# Patient Record
Sex: Female | Born: 1975 | Race: White | Hispanic: No | Marital: Married | Smoking: Current every day smoker
Health system: Southern US, Community
[De-identification: ages and names within clinical notes are randomized; demographics above are authoritative.]

## PROBLEM LIST (undated history)

## (undated) DIAGNOSIS — E0590 Thyrotoxicosis, unspecified without thyrotoxic crisis or storm: Secondary | ICD-10-CM

## (undated) DIAGNOSIS — E0500 Thyrotoxicosis with diffuse goiter without thyrotoxic crisis or storm: Secondary | ICD-10-CM

## (undated) DIAGNOSIS — G43901 Migraine, unspecified, not intractable, with status migrainosus: Secondary | ICD-10-CM

## (undated) DIAGNOSIS — I10 Essential (primary) hypertension: Secondary | ICD-10-CM

## (undated) DIAGNOSIS — D649 Anemia, unspecified: Secondary | ICD-10-CM

## (undated) DIAGNOSIS — S46811A Strain of other muscles, fascia and tendons at shoulder and upper arm level, right arm, initial encounter: Secondary | ICD-10-CM

## (undated) DIAGNOSIS — N10 Acute tubulo-interstitial nephritis: Secondary | ICD-10-CM

## (undated) HISTORY — DX: Essential (primary) hypertension: I10

## (undated) HISTORY — DX: Thyrotoxicosis with diffuse goiter without thyrotoxic crisis or storm: E05.00

## (undated) HISTORY — DX: Thyrotoxicosis, unspecified without thyrotoxic crisis or storm: E05.90

## (undated) HISTORY — PX: TUBAL LIGATION: SHX77

---
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
## 1998-11-13 ENCOUNTER — Encounter: Admission: RE | Admit: 1998-11-13 | Discharge: 1998-11-13 | Payer: Self-pay | Admitting: Family Medicine

## 1998-12-15 ENCOUNTER — Encounter: Admission: RE | Admit: 1998-12-15 | Discharge: 1998-12-15 | Payer: Self-pay | Admitting: Family Medicine

## 1999-06-05 ENCOUNTER — Encounter: Admission: RE | Admit: 1999-06-05 | Discharge: 1999-06-05 | Payer: Self-pay | Admitting: Family Medicine

## 1999-07-14 ENCOUNTER — Encounter: Admission: RE | Admit: 1999-07-14 | Discharge: 1999-07-14 | Payer: Self-pay | Admitting: Sports Medicine

## 1999-07-27 ENCOUNTER — Encounter: Admission: RE | Admit: 1999-07-27 | Discharge: 1999-07-27 | Payer: Self-pay | Admitting: Family Medicine

## 1999-08-14 ENCOUNTER — Encounter: Admission: RE | Admit: 1999-08-14 | Discharge: 1999-08-14 | Payer: Self-pay | Admitting: Family Medicine

## 1999-09-17 ENCOUNTER — Encounter: Admission: RE | Admit: 1999-09-17 | Discharge: 1999-09-17 | Payer: Self-pay | Admitting: Family Medicine

## 1999-10-13 ENCOUNTER — Encounter: Admission: RE | Admit: 1999-10-13 | Discharge: 1999-10-13 | Payer: Self-pay | Admitting: Family Medicine

## 1999-11-02 ENCOUNTER — Encounter: Admission: RE | Admit: 1999-11-02 | Discharge: 1999-11-02 | Payer: Self-pay | Admitting: Family Medicine

## 1999-12-18 ENCOUNTER — Inpatient Hospital Stay (HOSPITAL_COMMUNITY): Admission: AD | Admit: 1999-12-18 | Discharge: 1999-12-18 | Payer: Self-pay | Admitting: Obstetrics & Gynecology

## 1999-12-25 ENCOUNTER — Encounter: Admission: RE | Admit: 1999-12-25 | Discharge: 1999-12-25 | Payer: Self-pay | Admitting: Sports Medicine

## 2000-01-04 ENCOUNTER — Other Ambulatory Visit: Admission: RE | Admit: 2000-01-04 | Discharge: 2000-01-04 | Payer: Self-pay | Admitting: *Deleted

## 2000-01-04 ENCOUNTER — Encounter: Admission: RE | Admit: 2000-01-04 | Discharge: 2000-01-04 | Payer: Self-pay | Admitting: Family Medicine

## 2000-01-14 ENCOUNTER — Ambulatory Visit (HOSPITAL_COMMUNITY): Admission: RE | Admit: 2000-01-14 | Discharge: 2000-01-14 | Payer: Self-pay | Admitting: *Deleted

## 2000-01-14 ENCOUNTER — Encounter: Payer: Self-pay | Admitting: *Deleted

## 2000-03-01 ENCOUNTER — Encounter: Admission: RE | Admit: 2000-03-01 | Discharge: 2000-03-01 | Payer: Self-pay | Admitting: Family Medicine

## 2000-03-13 ENCOUNTER — Inpatient Hospital Stay (HOSPITAL_COMMUNITY): Admission: AD | Admit: 2000-03-13 | Discharge: 2000-03-13 | Payer: Self-pay | Admitting: Obstetrics

## 2000-05-06 ENCOUNTER — Inpatient Hospital Stay (HOSPITAL_COMMUNITY): Admission: AD | Admit: 2000-05-06 | Discharge: 2000-05-06 | Payer: Self-pay | Admitting: *Deleted

## 2000-05-12 ENCOUNTER — Encounter: Admission: RE | Admit: 2000-05-12 | Discharge: 2000-05-12 | Payer: Self-pay | Admitting: Family Medicine

## 2000-05-19 ENCOUNTER — Encounter: Admission: RE | Admit: 2000-05-19 | Discharge: 2000-05-19 | Payer: Self-pay | Admitting: Family Medicine

## 2000-05-20 ENCOUNTER — Inpatient Hospital Stay (HOSPITAL_COMMUNITY): Admission: AD | Admit: 2000-05-20 | Discharge: 2000-05-21 | Payer: Self-pay | Admitting: Obstetrics & Gynecology

## 2000-06-06 ENCOUNTER — Inpatient Hospital Stay (HOSPITAL_COMMUNITY): Admission: AD | Admit: 2000-06-06 | Discharge: 2000-06-06 | Payer: Self-pay | Admitting: Obstetrics

## 2000-06-10 ENCOUNTER — Encounter (INDEPENDENT_AMBULATORY_CARE_PROVIDER_SITE_OTHER): Payer: Self-pay | Admitting: Specialist

## 2000-06-10 ENCOUNTER — Inpatient Hospital Stay (HOSPITAL_COMMUNITY): Admission: AD | Admit: 2000-06-10 | Discharge: 2000-06-14 | Payer: Self-pay | Admitting: Obstetrics & Gynecology

## 2000-06-19 ENCOUNTER — Inpatient Hospital Stay: Admission: AD | Admit: 2000-06-19 | Discharge: 2000-06-19 | Payer: Self-pay | Admitting: Obstetrics

## 2000-06-20 ENCOUNTER — Inpatient Hospital Stay (HOSPITAL_COMMUNITY): Admission: AD | Admit: 2000-06-20 | Discharge: 2000-06-20 | Payer: Self-pay | Admitting: Obstetrics

## 2000-06-22 ENCOUNTER — Inpatient Hospital Stay (HOSPITAL_COMMUNITY): Admission: AD | Admit: 2000-06-22 | Discharge: 2000-06-22 | Payer: Self-pay | Admitting: Obstetrics

## 2000-06-23 ENCOUNTER — Inpatient Hospital Stay (HOSPITAL_COMMUNITY): Admission: AD | Admit: 2000-06-23 | Discharge: 2000-06-23 | Payer: Self-pay | Admitting: Obstetrics & Gynecology

## 2000-12-30 ENCOUNTER — Encounter: Admission: RE | Admit: 2000-12-30 | Discharge: 2000-12-30 | Payer: Self-pay | Admitting: Family Medicine

## 2001-10-28 ENCOUNTER — Emergency Department (HOSPITAL_COMMUNITY): Admission: EM | Admit: 2001-10-28 | Discharge: 2001-10-28 | Payer: Self-pay | Admitting: Emergency Medicine

## 2002-03-08 ENCOUNTER — Emergency Department (HOSPITAL_COMMUNITY): Admission: EM | Admit: 2002-03-08 | Discharge: 2002-03-08 | Payer: Self-pay | Admitting: Emergency Medicine

## 2002-12-18 ENCOUNTER — Emergency Department (HOSPITAL_COMMUNITY): Admission: EM | Admit: 2002-12-18 | Discharge: 2002-12-18 | Payer: Self-pay | Admitting: Emergency Medicine

## 2004-02-20 ENCOUNTER — Encounter: Admission: RE | Admit: 2004-02-20 | Discharge: 2004-02-20 | Payer: Self-pay | Admitting: Family Medicine

## 2004-03-27 LAB — CONVERTED CEMR LAB: Pap Smear: NORMAL

## 2004-04-08 ENCOUNTER — Other Ambulatory Visit: Admission: RE | Admit: 2004-04-08 | Discharge: 2004-04-08 | Payer: Self-pay | Admitting: Family Medicine

## 2004-04-08 ENCOUNTER — Encounter: Admission: RE | Admit: 2004-04-08 | Discharge: 2004-04-08 | Payer: Self-pay | Admitting: Family Medicine

## 2007-06-23 ENCOUNTER — Emergency Department (HOSPITAL_COMMUNITY): Admission: EM | Admit: 2007-06-23 | Discharge: 2007-06-24 | Payer: Self-pay | Admitting: Emergency Medicine

## 2008-08-09 ENCOUNTER — Emergency Department (HOSPITAL_COMMUNITY): Admission: EM | Admit: 2008-08-09 | Discharge: 2008-08-09 | Payer: Self-pay | Admitting: Emergency Medicine

## 2008-10-08 ENCOUNTER — Ambulatory Visit: Payer: Self-pay | Admitting: Family Medicine

## 2008-10-08 ENCOUNTER — Encounter: Payer: Self-pay | Admitting: Family Medicine

## 2008-10-08 DIAGNOSIS — G43909 Migraine, unspecified, not intractable, without status migrainosus: Secondary | ICD-10-CM | POA: Insufficient documentation

## 2008-10-14 ENCOUNTER — Encounter: Payer: Self-pay | Admitting: Family Medicine

## 2008-10-17 ENCOUNTER — Ambulatory Visit: Payer: Self-pay | Admitting: Family Medicine

## 2008-10-17 ENCOUNTER — Telehealth: Payer: Self-pay | Admitting: Family Medicine

## 2008-11-08 ENCOUNTER — Ambulatory Visit: Payer: Self-pay | Admitting: Family Medicine

## 2008-11-08 DIAGNOSIS — F411 Generalized anxiety disorder: Secondary | ICD-10-CM | POA: Insufficient documentation

## 2008-11-08 DIAGNOSIS — F419 Anxiety disorder, unspecified: Secondary | ICD-10-CM | POA: Insufficient documentation

## 2008-11-12 ENCOUNTER — Encounter: Payer: Self-pay | Admitting: Family Medicine

## 2008-11-14 ENCOUNTER — Encounter: Payer: Self-pay | Admitting: Family Medicine

## 2008-11-21 ENCOUNTER — Encounter: Payer: Self-pay | Admitting: Family Medicine

## 2009-01-21 ENCOUNTER — Emergency Department (HOSPITAL_COMMUNITY): Admission: EM | Admit: 2009-01-21 | Discharge: 2009-01-21 | Payer: Self-pay | Admitting: Emergency Medicine

## 2009-01-22 ENCOUNTER — Ambulatory Visit: Payer: Self-pay | Admitting: Family Medicine

## 2009-01-23 ENCOUNTER — Telehealth: Payer: Self-pay | Admitting: Family Medicine

## 2009-03-04 ENCOUNTER — Emergency Department (HOSPITAL_COMMUNITY): Admission: EM | Admit: 2009-03-04 | Discharge: 2009-03-04 | Payer: Self-pay | Admitting: Emergency Medicine

## 2009-03-05 ENCOUNTER — Ambulatory Visit: Payer: Self-pay | Admitting: Family Medicine

## 2009-03-06 ENCOUNTER — Telehealth: Payer: Self-pay | Admitting: Family Medicine

## 2009-04-03 ENCOUNTER — Emergency Department (HOSPITAL_COMMUNITY): Admission: EM | Admit: 2009-04-03 | Discharge: 2009-04-03 | Payer: Self-pay | Admitting: Emergency Medicine

## 2009-06-14 ENCOUNTER — Encounter (INDEPENDENT_AMBULATORY_CARE_PROVIDER_SITE_OTHER): Payer: Self-pay | Admitting: *Deleted

## 2009-06-14 DIAGNOSIS — F17200 Nicotine dependence, unspecified, uncomplicated: Secondary | ICD-10-CM | POA: Insufficient documentation

## 2009-11-11 ENCOUNTER — Encounter: Payer: Self-pay | Admitting: Family Medicine

## 2009-11-12 ENCOUNTER — Ambulatory Visit: Payer: Self-pay | Admitting: Family Medicine

## 2009-11-14 ENCOUNTER — Telehealth: Payer: Self-pay | Admitting: Psychology

## 2009-11-18 ENCOUNTER — Telehealth: Payer: Self-pay | Admitting: Family Medicine

## 2009-11-20 ENCOUNTER — Ambulatory Visit: Payer: Self-pay | Admitting: Family Medicine

## 2009-11-20 ENCOUNTER — Telehealth: Payer: Self-pay | Admitting: Psychology

## 2009-11-20 DIAGNOSIS — F329 Major depressive disorder, single episode, unspecified: Secondary | ICD-10-CM

## 2009-12-02 ENCOUNTER — Telehealth: Payer: Self-pay | Admitting: Family Medicine

## 2010-01-01 ENCOUNTER — Encounter: Payer: Self-pay | Admitting: *Deleted

## 2010-01-01 ENCOUNTER — Ambulatory Visit: Payer: Self-pay | Admitting: Family Medicine

## 2010-01-09 ENCOUNTER — Telehealth (INDEPENDENT_AMBULATORY_CARE_PROVIDER_SITE_OTHER): Payer: Self-pay | Admitting: *Deleted

## 2010-01-14 ENCOUNTER — Ambulatory Visit: Payer: Self-pay | Admitting: Family Medicine

## 2010-01-14 ENCOUNTER — Telehealth: Payer: Self-pay | Admitting: Family Medicine

## 2010-01-23 ENCOUNTER — Telehealth: Payer: Self-pay | Admitting: Family Medicine

## 2010-01-29 ENCOUNTER — Encounter: Payer: Self-pay | Admitting: *Deleted

## 2010-01-29 ENCOUNTER — Telehealth: Payer: Self-pay | Admitting: Family Medicine

## 2010-02-02 ENCOUNTER — Encounter: Payer: Self-pay | Admitting: Family Medicine

## 2010-03-31 ENCOUNTER — Ambulatory Visit: Payer: Self-pay | Admitting: Family Medicine

## 2010-03-31 ENCOUNTER — Inpatient Hospital Stay (HOSPITAL_COMMUNITY): Admission: EM | Admit: 2010-03-31 | Discharge: 2010-04-06 | Payer: Self-pay | Admitting: Emergency Medicine

## 2010-03-31 ENCOUNTER — Encounter: Payer: Self-pay | Admitting: *Deleted

## 2010-04-16 ENCOUNTER — Telehealth: Payer: Self-pay | Admitting: *Deleted

## 2010-04-22 ENCOUNTER — Encounter: Payer: Self-pay | Admitting: Family Medicine

## 2010-06-12 ENCOUNTER — Encounter: Payer: Self-pay | Admitting: *Deleted

## 2010-06-19 ENCOUNTER — Ambulatory Visit: Payer: Self-pay | Admitting: Family Medicine

## 2010-07-02 ENCOUNTER — Telehealth: Payer: Self-pay | Admitting: Family Medicine

## 2010-07-02 ENCOUNTER — Ambulatory Visit: Payer: Self-pay | Admitting: Family Medicine

## 2010-07-02 ENCOUNTER — Encounter: Payer: Self-pay | Admitting: Family Medicine

## 2010-07-02 DIAGNOSIS — E041 Nontoxic single thyroid nodule: Secondary | ICD-10-CM

## 2010-07-03 ENCOUNTER — Ambulatory Visit: Payer: Self-pay | Admitting: Family Medicine

## 2010-07-03 LAB — CONVERTED CEMR LAB
Free T4: 2.2 ng/dL — ABNORMAL HIGH (ref 0.80–1.80)
HCT: 36 % (ref 36.0–46.0)
Hemoglobin: 11.2 g/dL — ABNORMAL LOW (ref 12.0–15.0)
MCHC: 31.1 g/dL (ref 30.0–36.0)
RBC: 4.82 M/uL (ref 3.87–5.11)
RDW: 17.5 % — ABNORMAL HIGH (ref 11.5–15.5)
T3, Free: 7 pg/mL — ABNORMAL HIGH (ref 2.3–4.2)
WBC: 4.8 10*3/uL (ref 4.0–10.5)

## 2010-07-08 ENCOUNTER — Emergency Department (HOSPITAL_COMMUNITY): Admission: EM | Admit: 2010-07-08 | Discharge: 2010-07-09 | Payer: Self-pay | Admitting: Emergency Medicine

## 2010-07-09 ENCOUNTER — Encounter: Payer: Self-pay | Admitting: Family Medicine

## 2010-07-11 ENCOUNTER — Emergency Department (HOSPITAL_COMMUNITY): Admission: EM | Admit: 2010-07-11 | Discharge: 2010-07-12 | Payer: Self-pay | Admitting: Emergency Medicine

## 2010-07-13 ENCOUNTER — Telehealth: Payer: Self-pay | Admitting: Psychology

## 2010-07-16 ENCOUNTER — Encounter (HOSPITAL_COMMUNITY)
Admission: RE | Admit: 2010-07-16 | Discharge: 2010-09-26 | Payer: Self-pay | Source: Home / Self Care | Attending: Family Medicine | Admitting: Family Medicine

## 2010-07-22 ENCOUNTER — Ambulatory Visit: Payer: Self-pay | Admitting: Family Medicine

## 2010-07-22 ENCOUNTER — Telehealth: Payer: Self-pay | Admitting: Family Medicine

## 2010-07-22 DIAGNOSIS — E0500 Thyrotoxicosis with diffuse goiter without thyrotoxic crisis or storm: Secondary | ICD-10-CM | POA: Insufficient documentation

## 2010-07-27 ENCOUNTER — Encounter: Payer: Self-pay | Admitting: Family Medicine

## 2010-07-30 ENCOUNTER — Ambulatory Visit (HOSPITAL_COMMUNITY): Admission: RE | Admit: 2010-07-30 | Discharge: 2010-07-30 | Payer: Self-pay | Admitting: Family Medicine

## 2010-07-30 ENCOUNTER — Encounter: Payer: Self-pay | Admitting: Family Medicine

## 2010-07-31 ENCOUNTER — Telehealth: Payer: Self-pay | Admitting: Family Medicine

## 2010-08-07 ENCOUNTER — Ambulatory Visit: Payer: Self-pay | Admitting: Family Medicine

## 2010-09-04 ENCOUNTER — Ambulatory Visit: Payer: Self-pay | Admitting: Family Medicine

## 2010-10-12 ENCOUNTER — Telehealth: Payer: Self-pay | Admitting: Family Medicine

## 2010-10-23 ENCOUNTER — Telehealth: Payer: Self-pay | Admitting: Family Medicine

## 2010-10-27 NOTE — Assessment & Plan Note (Signed)
Summary: follow up from yesterday/kf   Vital Signs:  Patient profile:   35 year old female Menstrual status:  regular Height:      69 inches Weight:      182.4 pounds BMI:     27.03 Temp:     98.6 degrees F oral Pulse rate:   105 / minute BP sitting:   142 / 84  (left arm) Cuff size:   regular  Vitals Entered By: Garen Grams LPN (July 03, 2010 10:24 AM) CC: f/u labs Is Patient Diabetic? No Pain Assessment Patient in pain? no        Primary Care Provider:  Bobby Rumpf  MD  CC:  f/u labs.  History of Present Illness: 1) Hyperthyroid - During last hospitalization in July 2011 for pyelonephritis, also found to have enlarged thyroid, labs revealed low TSH, mildly elevated free T3, T4.  Thyroid ultrasound revealed slight enlargement of right lobe of thyroid with 2 adjacent 3.1 and 1.9 cm heterogenous solid nodules. Further work up was not performed during that hospitalization. Patient reports DECREASED energy, weight GAIN, hair LOSS. Labs performed on 07/02/10 with low TSH, elevated free T3 and T4, CBC with mild anemia, low MCV. History of anxiety as well - episodes of feeling anxious, rapid heartbeat - these a relieved by Xanax.   ROS: Denies swallowing difficulty, breathing difficulty, diarrhea, constipation, skin changes        Habits & Providers  Alcohol-Tobacco-Diet     Alcohol drinks/day: 2     Alcohol Counseling: not indicated; patient does not drink     Tobacco Status: current     Tobacco Counseling: to quit use of tobacco products     Cigarette Packs/Day: 1.0  Medications Prior to Update: 1)  Alprazolam 1 Mg Tabs (Alprazolam) .... One Tab By Mouth At Bedtime As Needed Anxiety 2)  Baclofen 10 Mg Tabs (Baclofen) .Marland Kitchen.. 1 Tab As Needed For Ha and Muscle Spasms 3)  Celexa 20 Mg Tabs (Citalopram Hydrobromide) .... One Tab By Mouth At Bedtime  Current Medications (verified): 1)  Baclofen 10 Mg Tabs (Baclofen) .Marland Kitchen.. 1 Tab As Needed For Ha and Muscle Spasms 2)  Celexa  20 Mg Tabs (Citalopram Hydrobromide) .... One Tab By Mouth At Bedtime 3)  Clonazepam 0.5 Mg Tabs (Clonazepam) .... One Tab By Mouth Three Times A Day  Allergies (verified): No Known Drug Allergies  Physical Exam  General:  alert, NAD, interactive, smiling Eyes:  no exophthalmos fundi normal  Mouth:  moist membranes  Neck:  right lobe of thyroid enlarged - no nodules palpated  Lungs:  CTAB  Heart:  tachycardic to 108 but regular rhythm, no murmurs  Abdomen:  soft, non-tender, normal bowel sounds, no distention, no masses, and no guarding.   Pulses:  2+ radials, rate 108 Extremities:  no edema Neurologic:  alert & oriented X3 and DTRs symmetrical and normal.   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 Given lab values, likely hot nodule, though would maintain malignancy on differential as well. Follow up one month. Referral for ablation vs. FNA based on results. Will switch to scheduled long half life benzodiazepine for anxiety. Consider add beta blocker as well if more symptomatic.   Orders: Nuclear Medicine (Nuclear Medicine) [facility]- Est  Level 4 ([address])  Complete Medication List: 1)  Baclofen 10 Mg Tabs (Baclofen) .Marland Kitchen.. 1 tab as  needed for ha and muscle spasms 2)  Celexa 20 Mg Tabs (Citalopram hydrobromide) .... One tab by mouth at bedtime 3)  Clonazepam 0.5 Mg Tabs (Clonazepam) .... One tab by mouth three times a day  Patient Instructions: 1)  Follow up with me in one month.  2)  We will get the sacn of your thyroid - I will let you know the results.  Prescriptions: CLONAZEPAM 0.5 MG TABS (CLONAZEPAM) one tab by mouth three times a day  #90 x 0   Entered and Authorized by:   Bobby Rumpf  MD   Signed by:   Bobby Rumpf  MD on 07/03/2010   Method used:   Print then Give to Patient   RxID:   [id number] CLONAZEPAM 0.5 MG TABS (CLONAZEPAM)  one tab by mouth three times a day  #90 x 0   Entered and Authorized by:   Bobby Rumpf  MD   Signed by:   Bobby Rumpf  MD on 07/03/2010   Method used:   Print then Give to Patient   RxID:   [id number]
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
## 2010-10-27 NOTE — Progress Notes (Signed)
Summary: meds  Phone Note Call from Patient Call back at Home Phone [phone]   Caller: Patient Summary of Call: needs to know when she can pick up new meds at pharmacy - [facility]  also referral for endo Initial call taken by: De Nurse,  July 22, 2010 4:57 PM    New/Updated Medications: ATENOLOL 25 MG TABS (ATENOLOL) one tab by mouth qday Prescriptions: ATENOLOL 25 MG TABS (ATENOLOL) one tab by mouth qday  #30 x 1   Entered and Authorized by:   Bobby Rumpf  MD   Signed by:   Bobby Rumpf  MD on 07/23/2010   Method used:   Electronically to        [facility] (retail)       [address]       [place], [place]  [address]       Ph: [phone]       Fax: [phone]   RxID:   [id number]  Please let know that script is at pharmacy for pickup. Thanks! Lavetta Nielsen  MD  July 23, 2010 10:27 AM

## 2010-10-27 NOTE — Consult Note (Signed)
Summary: Pathology Report  Pathology Report   Imported By: De Nurse 08/14/2010 16:23:35  _____________________________________________________________________  External Attachment:    Type:   Image     Comment:   External Document

## 2010-10-27 NOTE — Progress Notes (Signed)
  Phone Note Call from Patient   Caller: Patient Call For: [phone] Summary of Call: Have an appt tomorrow, but need to be seen today for migraines.  Please call back Initial call taken by: Abundio Miu,  July 02, 2010 8:41 AM  Follow-up for Phone Call        wants to be seen now for migraine. placed in work in. she will be here in 10 minutes   cancel appt tomorrow? Follow-up by: Golden Circle RN,  July 02, 2010 8:51 AM  Additional Follow-up for Phone Call Additional follow up Details #1::        ok to cancel appointment for tomorrow Additional Follow-up by: Bobby Rumpf  MD,  July 02, 2010 8:59 AM    Additional Follow-up for Phone Call Additional follow up Details #2::    it has been cancelled Follow-up by: Golden Circle RN,  July 02, 2010 9:05 AM
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
## 2010-10-27 NOTE — Assessment & Plan Note (Signed)
Summary: migraine persists depite meds   Vital Signs:  Patient Profile:   35 Years Old Female Weight:      205 pounds Temp:     98 degrees F Pulse rate:   96 / minute BP sitting:   132 / 71  (left arm)  Pt. in pain?   yes    Location:   head    Intensity:   9    Type:       aching  Vitals Entered By: Theresia Lo RN (October 17, 2008 3:21 PM)              Is Patient Diabetic? No     PCP:  Ruthe Mannan MD  Chief Complaint:  migraine headache.  History of Present Illness: Katie Obrien is a 35 year female presenting for migraine headache persisting despite medications.  She was seen by Dr. Yetta Barre 10/08/08. Please see her note for more details. At that visit, she was prescribed Topamax 50 mg daily for headache prevention and Flexeril 5 mg to take for acute headache, then Imitrex 2 hours later if headache persists. She was instructed to avoid OTC NSAIDs as they may cause rebound headaches. She was also instructed to cut caffeine and tobacco use. The patient states that she has not recieved a call from the [facility].   Today, she c/o 9-10/10, occipital, sharp-tight, constant headache x 1 month but worse x 3 days, that is associated with phonophobia, photophobia. She denies nausea, vomiting, vision changes, facial pain, ear pain. She is taking the Topamax daily. She has finished her prescriptions of Flexeril and Imitrex.    Prior Medication List:  FLEXERIL 5 MG TABS (CYCLOBENZAPRINE HCL) 1 tablet by mouth three times a day as needed for migraine headache TOPAMAX 50 MG TABS (TOPIRAMATE) 1 tablet by mouth every day for migraine prevention IMITREX 50 MG TABS (SUMATRIPTAN SUCCINATE) 1 tablet by mouth as needed for migraine.  Repeat in 2 hours if necessary.  Use no more than once per week. Dispense # apporved by insurance      Social History:    Reviewed history from 10/08/2008 and no changes required:       Works for [employer].  Lives  with husband and 2 sons (8yo and 11yo).  Smokes 1/3 ppd, drinks alcohol only socially.  Coffee intake- 5 cups daily.  Drinks 2-3 red bulls daily.     Risk Factors:    Review of Systems       The patient complains of headaches.  The patient denies fever, weight loss, weight gain, vision loss, decreased hearing, hoarseness, chest pain, and prolonged cough.     Physical Exam  General:     alert, NAD, sitting in dark room. Head:     normocephalic and atraumatic.   Eyes:     vision grossly intact, pupils equal, pupils round, and pupils reactive to light.   Neck:     supple, no thyromegaly, and no cervical lymphadenopathy.   Lungs:     Normal respiratory effort, chest expands symmetrically. Lungs are clear to auscultation, no crackles or wheezes. Heart:     Normal rate and regular rhythm. S1 and S2 normal without gallop, murmur, click, rub or other extra sounds. Neurologic:     alert & oriented X3, cranial nerves II-XII intact, strength normal in all extremities, sensation intact to light touch, and gait normal.  patellar DTRs 2+ b/l. Psych:     normally interactive and good  eye contact.      Impression & Recommendations:  Problem # 1:  HEADACHE, TENSION (ICD-307.81) Assessment: Deteriorated Likely multifactorial in etiology. The patient has a past medical history significant for migraines but her presentation at this time seems more consistent with a tension headache. Encouraged patient to follow through with recs from Dr. Yetta Barre: limit smoking, caffeine, alcohol, OTC meds. Educated patient on rebound headaches (may take up to two months to resolve if this is etiology and will be concern once she stops caffeine). Encouraged lifestyle changes including exercise, stretching, massage. Discussed the patient's job at [employer] sitting at a desk, working on the computer, and answering phones throughout the day: all lead to tension headache. The patient also complains that her  headaches are worse around menses and states that her menses last 2 weeks each month. Would rec: follow up on this issue with PCP. Will not check labs today. Follow up in 2 weeks with Dr. Dayton Martes.  Will Rx: Midrin as this helped in past with doseage for tension headaches. Discussed this medication with PharmD. Will refill Flexeril.   The following medications were removed from the medication list:    Imitrex 50 Mg Tabs (Sumatriptan succinate) .Marland Kitchen... 1 tablet by mouth as needed for migraine.  repeat in 2 hours if necessary.  use no more than once per week. dispense # apporved by insurance  Her updated medication list for this problem includes:    Midrin 325-65-100 Mg Caps (Apap-isometheptene-dichloral) .Marland Kitchen... 1-2 caps every 4 hours as needed for pain. max 8 caps in 24 hours.  Orders: [facility]- Est Level  3 ([address])   Complete Medication List: 1)  Flexeril 5 Mg Tabs (Cyclobenzaprine hcl) .Marland Kitchen.. 1 tablet by mouth three times a day as needed for migraine headache 2)  Topamax 50 Mg Tabs (Topiramate) .Marland Kitchen.. 1 tablet by mouth every day for migraine prevention 3)  Midrin 325-65-100 Mg Caps (Apap-isometheptene-dichloral) .Marland Kitchen.. 1-2 caps every 4 hours as needed for pain. max 8 caps in 24 hours.   Patient Instructions: 1)  Please schedule a follow-up appointment in 2 weeks. 2)  Your headache is probably related to tension rather than migraine. Treatment should be geared toward lifestyle changes including exercise, stretching, and other relaxation techniques.   Prescriptions: FLEXERIL 5 MG TABS (CYCLOBENZAPRINE HCL) 1 tablet by mouth three times a day as needed for migraine headache  #60 x 1   Entered and Authorized by:   Helane Rima MD   Signed by:   Helane Rima MD on 10/17/2008   Method used:   Electronically to        [facility] (retail)       [address]       [place], [place]  [address]       Ph: [phone]       Fax: [phone]   RxID:    [id number] MIDRIN 325-65-100 MG CAPS (APAP-ISOMETHEPTENE-DICHLORAL) 1-2 caps every 4 hours as needed for pain. MAX 8 caps in 24 hours.  #20 x 0   Entered and Authorized by:   Helane Rima MD   Signed by:   Helane Rima MD on 10/17/2008   Method used:   Print then Give to Patient   RxID:   [id number]

## 2010-10-27 NOTE — Progress Notes (Signed)
Summary: meds prob  Phone Note Call from Patient Call back at [phone]   Caller: Patient Summary of Call: pt has been taking 3-4 per day and is now out -(was told to take 1 as needed and she needed more to help her anxiety) would like more called in. has an appt Friday Initial call taken by: De Nurse,  December 02, 2009 10:18 AM  Follow-up for Phone Call        to pcp to handle Follow-up by: Golden Circle RN,  December 02, 2009 10:24 AM  Additional Follow-up for Phone Call Additional follow up Details #1::        Will refill x 1  Additional Follow-up by: Bobby Rumpf  MD,  December 03, 2009 1:35 PM    Prescriptions: ALPRAZOLAM 0.5 MG TABS (ALPRAZOLAM) one tab by mouth at bedtime for anxiety  #34 x 0   Entered and Authorized by:   Bobby Rumpf  MD   Signed by:   Bobby Rumpf  MD on 12/03/2009   Method used:   Print then Give to Patient   RxID:   [id number]   Appended Document: meds prob pt notified that rx ready for pick up.

## 2010-10-27 NOTE — Progress Notes (Signed)
  Phone Note Call from Patient   Caller: Patient Summary of Call: wanted to know if she could have an Abx called in. she has finished her course of tuesday and is still having signs and symtoms of what she was hospitalized for. she still has pain meds but would like Abx she is currently in [place]. After speaking with pt further she informed me that she was running a temp of 103. I looked back at her d/c instructions and noticed that she was to return to the ED or clinic if her temp got to 100.3 I told her to go to the ED to be seen and she agreed Initial call taken by: Loralee Pacas CMA,  April 16, 2010 2:57 PM
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
## 2010-10-27 NOTE — Progress Notes (Signed)
Summary: triage  Phone Note Call from Patient Call back at Home Phone [phone]   Caller: Patient Summary of Call: Pt has finished her meds and sees Dr. on Thursday.  Wondering if she can get enough of the Xanax until she comes in on Thursday? Pharmacy is [facility] [phone]. Initial call taken by: Clydell Hakim,  November 18, 2009 9:01 AM  Follow-up for Phone Call        will forward to MD. Follow-up by: Theresia Lo RN,  November 18, 2009 10:41 AM  Additional Follow-up for Phone Call Additional follow up Details #1::        sent to md. also flagged as urgent Additional Follow-up by: Golden Circle RN,  November 18, 2009 1:37 PM    Additional Follow-up for Phone Call Additional follow up Details #2::    Patient was given 10 pills to take one tab by mouth at bedtime at last visit. Should not be out of medications at this time. Will need to come to appointment first.  Follow-up by: Bobby Rumpf  MD,  November 18, 2009 9:27 PM   Appended Document: triage states one at bedtime was not working at all. agreed to wait until appt tomorrow to discuss with him
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
## 2010-10-27 NOTE — Progress Notes (Signed)
Summary: Schedule initial beh-med  Phone Note Call from Patient   Caller: Patient Call For: Spero Geralds, Psy.D. Summary of Call: Patient called for an appt.  Put her in for Tuesday, 07/21/2010 at 3:30.  Will let PCP know. Initial call taken by: Spero Geralds PsyD,  July 13, 2010 3:42 PM

## 2010-10-27 NOTE — Assessment & Plan Note (Signed)
Summary: anxiety,df   Vital Signs:  Patient profile:   35 year old female Menstrual status:  regular Height:      69 inches Weight:      186.4 pounds BMI:     27.63 Temp:     98.0 degrees F oral Pulse rate:   84 / minute BP sitting:   117 / 73  (left arm) Cuff size:   regular  Vitals Entered By: Garen Grams LPN (June 19, 2010 9:50 AM) CC: anxiety Is Patient Diabetic? No Pain Assessment Patient in pain? no        Primary Care Provider:  Bobby Rumpf  MD  CC:  anxiety.  History of Present Illness: 1) Anxiety/Depression: Started on Welbutrin and Xanax 0.5 mg as needed for depression / anxiety symptoms (compounded by [employer] domestic assalut) in February 2011. Since that time has missed multiple appointments, including [facility] with Dr. Pascal Lux. Did not take the Wellbutrin as she could not afford it. Reports continued depressive symptoms of decreased energy and appetite, increased sleep and anhedonia - these had been going on for at least past two months prior to her visit with me in February 2011. relationship. Denies suicidal or homicidal ideation. Things that make her happy = "spending time with [her] kids, "being outdoors walking". Has history of depression as a teenager and intermittently as an adult - had suicidal ideation with Zoloft for which she had to be hospitalized. Also reports suicidal ideation with Chantix. Had been on Wellbutrin in the past (not on her medlist) and this worked well for quitting smoking (but she was not having depressive symptoms at the time). Has not seen a psychiatrist since 1996.    See prior meds for med rec   Habits & Providers  Alcohol-Tobacco-Diet     Alcohol drinks/day: 2     Alcohol Counseling: not indicated; patient does not drink     Tobacco Status: current     Tobacco Counseling: to quit use of tobacco products     Cigarette Packs/Day: 1.0  Medications Prior to Update: 1)  Alprazolam 0.5 Mg Tabs (Alprazolam) ....  One Tab By Mouth At Bedtime For Anxiety 2)  Baclofen 10 Mg Tabs (Baclofen) .Marland Kitchen.. 1 Tab As Needed For Ha and Muscle Spasms  Allergies (verified): No Known Drug Allergies  Family History: sister has migraines mother-HTN, DM2 father- deceased, unknown cause, had mental health issues. brother- DM2 son - obesity   Social History: Works for [employer], [employer].  Lives with  2 sons (10 and 58).  Smokes 1/2 ppd, drinks alcohol only socially.  Physical Exam  General:  alert, NAD, interactive, smiling Psych:  Appears happy today. Less anxious. Oriented X3, memory intact for recent and remote, normally interactive, good eye contact, not agitated, not suicidal, and not homicidal.     Impression & Recommendations:  Problem # 1:  DEPRESSION (ICD-311) Assessment Unchanged  Her updated medication list for this problem includes:    Alprazolam 1 Mg Tabs (Alprazolam) ..... One tab by mouth at bedtime as needed anxiety    Celexa 20 Mg Tabs (Citalopram hydrobromide) ..... One tab by mouth at bedtime  History of depression as a teen and intermittently as an adult. Will start Celexa for cost. Follow up in 2 weeks. Denies SI/HI. Patient to think about and enact behaviors that make her happy. Advised regarding need for taking medications as prescribed and need for followup prior to futher refills given multiple missed appointments.   Orders: [facility]- Est  Level  3 (99213)  Problem # 2:  ANXIETY (ICD-300.00) Assessment: Unchanged  Her updated medication list for this problem includes:    Alprazolam 1 Mg Tabs (Alprazolam) ..... One tab by mouth at bedtime as needed anxiety    Celexa 20 Mg Tabs (Citalopram hydrobromide) ..... One tab by mouth at bedtime  Xanax bridge to Celexa. Patient to think about and enact healthy behaviors that make her happy. Contribution of depression by DSM-IV criteria as above. Follow up 2 weeks. Follow up with Dr. Pascal Lux in [facility] clinic. No SI or HI.   Orders: [facility]-  Est Level  3 ([address])  Complete Medication List: 1)  Alprazolam 1 Mg Tabs (Alprazolam) .... One tab by mouth at bedtime as needed anxiety 2)  Baclofen 10 Mg Tabs (Baclofen) .Marland Kitchen.. 1 tab as needed for ha and muscle spasms 3)  Celexa 20 Mg Tabs (Citalopram hydrobromide) .... One tab by mouth at bedtime  Patient Instructions: 1)  Follow up in two weeks.  2)  Take medications as prescribed.  Prescriptions: ALPRAZOLAM 1 MG TABS (ALPRAZOLAM) one tab by mouth at bedtime as needed anxiety  #15 x 0   Entered and Authorized by:   Bobby Rumpf  MD   Signed by:   Bobby Rumpf  MD on 06/19/2010   Method used:   Print then Give to Patient   RxID:   [id number] CELEXA 20 MG TABS (CITALOPRAM HYDROBROMIDE) one tab by mouth at bedtime  #30 x 1   Entered and Authorized by:   Bobby Rumpf  MD   Signed by:   Bobby Rumpf  MD on 06/19/2010   Method used:   Print then Give to Patient   RxID:   [id number]

## 2010-10-27 NOTE — Consult Note (Signed)
Summary: [facility]   Imported By: Clydell Hakim 04/28/2010 11:50:34  _____________________________________________________________________  External Attachment:    Type:   Image     Comment:   External Document  Appended Document: [facility] Reviewed.

## 2010-10-27 NOTE — Assessment & Plan Note (Signed)
Summary: f/u eo   Vital Signs:  Patient profile:   35 year old female Menstrual status:  regular Height:      69 inches Weight:      194.4 pounds BMI:     28.81 Temp:     98.2 degrees F oral Pulse rate:   46 / minute BP sitting:   114 / 70  (left arm) Cuff size:   regular  Vitals Entered By: Garen Grams LPN (August 07, 2010 9:56 AM) CC: f/u thyroid biopsy Is Patient Diabetic? No Pain Assessment Patient in pain? no        Primary Care Provider:  Bobby Rumpf  MD  CC:  f/u thyroid biopsy.  History of Present Illness: 1) Hyperthyroid - s/p RAIU scan with homogeneous mildly increased uptake within the thyroid gland suggests mild Graves' disease; cold nodule in the right lobe of thyroid gland corresponds to the nodule on comparison ultrasound. s/p biopsy of cold nodule with hyperplastic nodule. Patient was started on atenolol with some improvement in symptoms of rapid heartbeat, but continues to have anxiety   ROS: Denies swallowing difficulty, breathing difficulty, diarrhea, constipation, skin changes       Habits & Providers  Alcohol-Tobacco-Diet     Alcohol drinks/day: 2     Alcohol Counseling: not indicated; patient does not drink     Tobacco Status: current     Tobacco Counseling: to quit use of tobacco products     Cigarette Packs/Day: 1.0  Current Medications (verified): 1)  Atenolol 25 Mg Tabs (Atenolol) .... One Tab By Mouth Qday  Allergies (verified): No Known Drug Allergies  Physical Exam  General:  alert, NAD, interactive, smiling - vitals reviewed. rate recorded as 42 (rechecked x two 5 min apart found to be 70s to 80s) Mouth:  moist membranes  Neck:  right lobe of thyroid enlarged - no nodules palpated  Lungs:  CTAB  Heart:  RRR rate 80, no murmurs  Abdomen:  soft, non-tender, normal bowel sounds, no distention, no masses, and no guarding.   Pulses:  2+ radials,  Extremities:  no edema Neurologic:  alert & oriented X3 and DTRs symmetrical and  normal.     Impression & Recommendations:  Problem # 1:  GRAVES' DISEASE (ICD-242.00) Assessment Unchanged  Her updated medication list for this problem includes:    Atenolol 25 Mg Tabs (Atenolol) ..... One tab by mouth qday  RAIU scan consistent with Graves'. Will continue with atenolol 25 mg by mouth qday (no side effects from medications)  Biopsy of cold nodule with hyperplastic nodule (benign). Referred to endocrine - appointment on 08/26/10 - will fax over these notes and results of testing as well.   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 No surgical treatment indicated at this time. Will follow with ultrasound in 6 months then at 12 months, then yearly for three years.   Orders: [facility]- Est  Level 4 (62952)  Complete Medication List: 1)  Atenolol 25 Mg Tabs (Atenolol) .... One tab by mouth qday  Patient Instructions: 1)  Follow up with me in two months.  2)  Your nodule is BENIGN!!!   Orders Added: 1)  [facility]- Est  Level 4 [84132]

## 2010-10-27 NOTE — Letter (Signed)
Summary: Suspension Letter  [facility]  [address]   [place], [place] [address]   Phone: [phone]  Fax: [phone]    06/12/2010  Katie Obrien [address] [place], [place]  [address]  Dear [name],  You have missed 4 scheduled appointments with our practice.If you cannot keep your appointment, we expect you to call and cancel at least 24 hours before your appointment time.  As per our policy, we will now only give you limited medical services. means we will not call in a refill for you, or complete a form or make a referral except when you are here for a scheduled office visit.   If you miss 2 more appointments in the next year, we will dismiss you from our practice.  We hope this does not happen.  If you keep your appointments for the next year you will be returned to regular patient status.  We hope these changes will encourage you to keep your appointments so we may provide you the best medical care.   Our office staff can be reached at [phone] Monday through Friday from 8:30 a.m.-5:00 p.m. and will be glad to schedule your appointment as necessary.     Sincerely,   [facility]

## 2010-10-27 NOTE — Letter (Signed)
Summary: FMLA  FMLA   Imported By: De Nurse 02/02/2010 10:46:49  _____________________________________________________________________  External Attachment:    Type:   Image     Comment:   External Document
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
## 2010-10-27 NOTE — Assessment & Plan Note (Signed)
Summary: migraines/[place]/carew-cancel appt tomorrow?  APPOINTMENT UN-CANCELLED - CHANGED TO 10:15 AM on 07/03/10 Katie Rumpf  MD  July 02, 2010 10:53 AM       Allergies: No Known Drug Allergies   Complete Medication List: 1)  Alprazolam 1 Mg Tabs (Alprazolam) .... One tab by mouth at bedtime as needed anxiety 2)  Baclofen 10 Mg Tabs (Baclofen) .Marland Kitchen.. 1 tab as needed for ha and muscle spasms 3)  Celexa 20 Mg Tabs (Citalopram hydrobromide) .... One tab by mouth at bedtime  Other Orders: Benadryl  IM or IV (J1200) Compazine Injection (C3762) Dexamethasone Sodium Phosphate 1mg  (J1100)   Medication Administration  Injection # 1:    Medication: Benadryl  IM or IV    Diagnosis: MIGRAINE HEADACHE (ICD-346.90)    Route: IM    Site: RUOQ gluteus    Exp Date: 02/2011    Lot #: [id number]    Mfr: [employer]    Comments: 25mg     Patient tolerated injection without complications    Given by: Jimmy Footman, CMA (July 02, 2010 10:41 AM)  Injection # 3:    Medication: Compazine Injection    Diagnosis: MIGRAINE HEADACHE (ICD-346.90)    Route: IM    Site: RUOQ gluteus    Exp Date: 03/2011    Lot #: [id number]    Mfr: bedford    Comments: 10mg     Patient tolerated injection without complications    Given by: Jimmy Footman, CMA (July 02, 2010 10:43 AM)  Injection # 5:    Medication: Dexamethasone Sodium Phosphate 1mg     Diagnosis: MIGRAINE HEADACHE (ICD-346.90)    Route: IM    Site: LUOQ gluteus    Exp Date: 12/2011    Lot #: [id number]    Mfr: [employer]    Comments: 10mg  given    Patient tolerated injection without complications    Given by: Jimmy Footman, CMA (July 02, 2010 10:43 AM)  Orders Added: 1)  Benadryl  IM or IV [J1200] 2)  Compazine Injection [J0780] 3)  Dexamethasone Sodium Phosphate 1mg  [J1100]

## 2010-10-27 NOTE — Progress Notes (Signed)
Summary: results  Phone Note Call from Patient Call back at [phone]   Caller: Patient Summary of Call: pt is wanting results of test - asap Initial call taken by: De Nurse,  July 31, 2010 10:01 AM  Follow-up for Phone Call        Advised to call for appointment next week and that I had not yet received the results.  Follow-up by: Bobby Rumpf  MD,  July 31, 2010 12:46 PM
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
## 2010-10-27 NOTE — Assessment & Plan Note (Signed)
Summary: migrain,df   Vital Signs:  Patient profile:   35 year old female Menstrual status:  regular Weight:      185 pounds Temp:     98.5 degrees F oral Pulse rate:   101 / minute Pulse rhythm:   regular BP sitting:   119 / 80  (left arm) Cuff size:   regular  Vitals Entered By: Loralee Pacas CMA (September 04, 2010 10:52 AM) CC: migraines x 1 week   Primary Care Provider:  Bobby Rumpf  MD  CC:  migraines x 1 week.  History of Present Illness: 1) Migraine: Headache (similar to previous migraines) x 2 days. No relief with Goody Powders. +nausea, 2 episodes of non bilious emesis as well. Worse with bright lights, loud noises, moving around. Better with rest. Has been under a lot of stress lately - migraines usually worse with stress. Reports bilateral shoulder soreness as well.        2) Hyperthyroid - s/p RAIU scan with homogeneous mildly increased uptake within the thyroid gland suggests mild Graves' disease; cold nodule in the right lobe of thyroid gland corresponds to the nodule on comparison ultrasound. s/p biopsy of cold nodule with hyperplastic nodule (benign). Patient notes some improvement with atenolol with symptoms of rapid heartbeat, but still reports some occasional tachycardia and jitteriness.    ROS: Denies swallowing difficulty, breathing difficulty, diarrhea, constipation, skin changes, weakness, focal neurological findings   Med Rec: Atenolol 25 mg by mouth qday; Goody powders as needed headache       Allergies (verified): No Known Drug Allergies  Physical Exam  General:  alert, NAD, interactive, smiling - vitals reviewed. mild tachycardia  Head:  NCAT  Eyes:  no exophthalmos fundi normal  Neck:  right lobe of thyroid enlarged - no nodules palpated  Heart:  tachy to 100, regular rate no murmurs  Msk:  tender to palpation bilateral trapezius  Pulses:  2+ radials - tachy to 100 Extremities:  no edema    Impression & Recommendations:  Problem # 1:   MIGRAINE HEADACHE (ICD-346.90) Assessment Deteriorated  Her updated medication list for this problem includes:    Atenolol 50 Mg Tabs (Atenolol) ..... One tab by mouth qday  Will treat with migraine cocktail - dexamethasone, compazine and diphenhydramine as below. Likely some degree of tension headache as well. Hopefully beta blocker for Graves might help with migraine prophylaxis as well. Advised to avoid Goody Powders.   Orders: [facility]- Est  Level 4 ([address])  Problem # 2:  GRAVES' DISEASE (ICD-242.00) Assessment: Unchanged  Her updated medication list for this problem includes:    Atenolol 50 Mg Tabs (Atenolol) ..... One tab by mouth qday  Will increase atenolol to 50 mg for better symptom (objective and subjective) control. Still awaiting endocrine visit due to lack of insurance. Will follow.   Orders: [facility]- Est  Level 4 ([address])  Complete Medication List: 1)  Atenolol 50 Mg Tabs (Atenolol) .... One tab by mouth qday  Patient Instructions: 1)  Follow up with me after you get in with the endocrinologist. 2)  Do not take too many Goody Powders - try Tyelnol or ibuprofen  Prescriptions: ATENOLOL 50 MG TABS (ATENOLOL) one tab by mouth qday  #30 x 3   Entered and Authorized by:   Bobby Rumpf  MD   Signed by:   Bobby Rumpf  MD on 09/04/2010   Method used:   Electronically to        [facility] (retail)       [address]       [place], [place]  [address]       Ph: [phone]       Fax: [phone]   RxID:   [id number] ATENOLOL 50 MG TABS (ATENOLOL) one tab by mouth qday  #30 x 3   Entered and Authorized by:   Bobby Rumpf  MD   Signed by:   Bobby Rumpf  MD on 09/04/2010   Method used:   Electronically to        [facility]  [address] [id number] (retail)       [address]       [place], [place]  [address]       Ph: [phone] or [phone]       Fax: [phone]   RxID:   [id number]    Orders Added: 1)  [facility]-  Est  Level 4 [[address]]  Appended Document: migrain,df   Medication Administration  Injection # 1:    Medication: Dexamethasone Sodium Phosphate 1mg     Diagnosis: MIGRAINE HEADACHE (ICD-346.90)    Route: IM    Site: RUOQ gluteus    Exp Date: 12/2011    Lot #: [id number]    Mfr: [employer]    Patient tolerated injection without complications    Given by: Jimmy Footman, CMA (September 04, 2010 2:17 PM)  Injection # 2:    Medication: Benadryl  IM or IV    Diagnosis: MIGRAINE HEADACHE (ICD-346.90)    Route: IM    Site: RUOQ gluteus    Exp Date: 05/2012    Lot #: [id number]    Mfr: [email or web address]    Patient tolerated injection without complications    Given by: Jimmy Footman, CMA (September 04, 2010 2:18 PM)  Injection # 3:    Medication: Compazine Injection    Diagnosis: MIGRAINE HEADACHE (ICD-346.90)    Route: IM    Site: LUOQ gluteus    Exp Date: 03/2011    Lot #: [id number]    Mfr: [facility] [employer]    Patient tolerated injection without complications    Given by: Jimmy Footman, CMA (September 04, 2010 2:19 PM)  Orders Added: 1)  Dexamethasone Sodium Phosphate 1mg  [J1100] 2)  Benadryl  IM or IV [J1200] 3)  Compazine Injection [J0780]

## 2010-10-27 NOTE — Miscellaneous (Signed)
Summary: call from [facility] ED  Clinical Lists Changes Shelda Jakes from [facility] ED behavioral side asked that pcp call her asap [phone] or [phone]. to pcp.Golden Circle RN  July 09, 2010 3:42 PM  Reported intentional overdose due to anxiety about thyroid nodules (see ER notes). I faxed over my office visits to provide information regarding her recent appointments for this problem.  Bobby Rumpf  MD  July 09, 2010 5:17 PM
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
## 2010-10-27 NOTE — Assessment & Plan Note (Signed)
Summary: migranes   Vital Signs:  Patient profile:   35 year old female Menstrual status:  regular Height:      69 inches Weight:      182 pounds BMI:     26.97 Temp:     98.9 degrees F oral Pulse rate:   97 / minute BP sitting:   136 / 66  (left arm) Cuff size:   regular  Vitals Entered By: Jimmy Footman, CMA (July 02, 2010 9:42 AM) CC: migranes x3 day Is Patient Diabetic? No Comments pt is also concerned about enlarged thyroid and would like blood work done   Primary Care Provider:  Bobby Rumpf  MD  CC:  migranes x3 day.  History of Present Illness: 1) Migraines: Headache (similar to previous migraines) x 3 days. No relief with ibuprofen. +nausea, 2 episodes of non bilious emesis as well. Worse with bright lights, loud noises, moving around. Better with rest. Has been under a lot of stress lately - migraines usually worse with stress.   ROS: Denies weakness, focal neurological signs, lethargy, vision change, URI symptoms   2) Hyperthyroid - During last hospitalization in July 2011 for pyelonephritis, also found to have enlarged thyroid, labs revealed low TSH, mildly elevated free T3, T4.  Thyroid ultrasound revealed slight enlargement of right lobe of thyroid with 2 adjacent 3.1 and 1.9 cm heterogenous solid nodules. Further work up was not performed during that hospitalization. Patient reports DECREASED energy, weight GAIN, hair LOSS.        Habits & Providers  Alcohol-Tobacco-Diet     Tobacco Status: current  Problems Prior to Update: 1)  Depression  (ICD-311) 2)  Tobacco User  (ICD-305.1) 3)  Well Woman  (ICD-V70.0) 4)  Anxiety  (ICD-300.00) 5)  Headache, Tension  (ICD-307.81) 6)  Migraine Headache  (ICD-346.90)  Current Medications (verified): 1)  Alprazolam 1 Mg Tabs (Alprazolam) .... One Tab By Mouth At Bedtime As Needed Anxiety 2)  Baclofen 10 Mg Tabs (Baclofen) .Marland Kitchen.. 1 Tab As Needed For Ha and Muscle Spasms 3)  Celexa 20 Mg Tabs (Citalopram Hydrobromide)  .... One Tab By Mouth At Bedtime  Allergies (verified): No Known Drug Allergies  Physical Exam  General:  alert, NAD, interactive, smiling Eyes:  no exophthalmos fundi normal  Mouth:  moist membranes  Neck:  right lobe of thyroid enlarged - no nodules palpated  Lungs:  CTAB  Heart:  RRR no murmurs  Neurologic:  alert & oriented X3, cranial nerves II-XII intact, strength normal in all extremities, and DTRs symmetrical and normal.     Impression & Recommendations:  Problem # 1:  THYROID NODULE, RIGHT (ICD-241.0) Assessment New Will have patient come in tomorrow to discuss further work up. Will likely proceed with RAIU scan and endocrine referral. Patient with Medicaid pending. Will check labs as below.   Orders: CBC-[facility] ([address]) TSH-[facility] [id number]) Free T3-[facility] [id number]) Free T4-[facility] [id number]) [facility]- Est  Level 4 ([address])  Problem # 2:  MIGRAINE HEADACHE (ICD-346.90) Assessment: Deteriorated  Will treat with migraine cocktail - dexamethasone, compazine and diphenhydramine as below. Follow up tomorrow as well.   Orders: [facility]- Est  Level 4 ([address])  Complete Medication List: 1)  Alprazolam 1 Mg Tabs (Alprazolam) .... One tab by mouth at bedtime as needed anxiety 2)  Baclofen 10 Mg Tabs (Baclofen) .Marland Kitchen.. 1 tab as needed for ha and muscle spasms 3)  Celexa 20 Mg Tabs (Citalopram hydrobromide) .... One tab by mouth at bedtime  Patient Instructions: 1)  Come  in tomorrow for your regular appointment. We need to talk about your thyroid.

## 2010-10-27 NOTE — Assessment & Plan Note (Signed)
Summary: F/U  Katie Obrien   Vital Signs:  Patient profile:   35 year old female Menstrual status:  regular Height:      69 inches Weight:      187.44 pounds BMI:     27.78 BSA:     2.01 Temp:     98.5 degrees F Pulse rate:   110 / minute BP sitting:   123 / 80  Vitals Entered By: Jone Baseman CMA (July 22, 2010 10:55 AM) CC: f/u thyroid  Is Patient Diabetic? No Pain Assessment Patient in pain? no        Primary Care Dinah Lupa:  Bobby Rumpf  MD  CC:  f/u thyroid .  History of Present Illness: 1) Hyperthyroid - During last hospitalization in July 2011 for pyelonephritis, also found to have enlarged thyroid, labs revealed low TSH, mildly elevated free T3, T4.  Thyroid ultrasound revealed slight enlargement of right lobe of thyroid with 2 adjacent 3.1 and 1.9 cm heterogenous solid nodules. Further work up was not performed during that hospitalization. Patient reported DECREASED energy, weight GAIN, hair LOSS. Labs performed on 07/02/10 with low TSH, elevated free T3 and T4, CBC with mild anemia, low MCV. History of anxiety as well - episodes of feeling anxious, rapid heartbeat, diaphoresis, feeling hot. RAIU scan shows - 1.  Homogeneous mildly increased uptake within the thyroid gland suggests mild Graves' disease. 2. Cold nodule in the right lobe of thyroid gland corresponds to the nodule on comparison ultrasound.   Of note patient had an intentional overdose on 10/13 with clonazepam due to due to anxiety about thyroid nodules (see ER notes). Patient also shaved all of her hair off because she thought she would need chemotherapy.  I faxed over my office visits to provide information regarding her recent appointments for this problem and spoke extensivley with Shelda Jakes from [facility] ED regarding this behavior. Patient was not kept inpatient as she was believed to not be at risk of further self harm. Patient denies SI/HI/thoughts of self harm today.   ROS: Denies swallowing difficulty,  breathing difficulty, diarrhea, constipation, skin changes       Habits & Providers  Alcohol-Tobacco-Diet     Alcohol drinks/day: 2     Alcohol Counseling: not indicated; patient does not drink     Tobacco Status: current     Tobacco Counseling: to quit use of tobacco products     Cigarette Packs/Day: 1.0  Current Medications (verified): 1)  None  Allergies (verified): No Known Drug Allergies  Physical Exam  General:  alert, NAD, interactive, smiling Neck:  right lobe of thyroid enlarged - no nodules palpated  Lungs:  CTAB  Heart:  tachycardic to 108 but regular rhythm, no murmurs  Abdomen:  soft, non-tender, normal bowel sounds, no distention, no masses, and no guarding.   Pulses:  2+ radials, rate 108 Extremities:  no edema Neurologic:  alert & oriented X3 and DTRs symmetrical and normal.     Impression & Recommendations:  Problem # 1:  THYROID NODULE, RIGHT (ICD-241.0) Assessment Unchanged  Will refer for FNA given results of RAIU scan.   Orders: [place]- Est  Level 4 ([address])  Problem # 2:  GRAVES' DISEASE (ICD-242.00) Assessment: New  RAIU scan consistent with Graves'. Will treat with atenolol 50 mg by mouth qday. Will obtain biopsy of cold nodule prior to more definitive treatment. Will attempt to refer to endocrine but patient's lack of insurance may seriously hamper these efforts.   Orders: [facility]- Est  Level 4 ([address])   Orders Added: 1)  [facility]- Est  Level 4 [[address]]
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
## 2010-10-27 NOTE — Progress Notes (Signed)
Summary: Short term disability form request  Phone Note Outgoing Call   Call placed by: Spero Geralds PsyD,  November 20, 2009 3:26 PM Call placed to: Insurer Summary of Call: Daylene Posey CMS at [phone].  They had asked completion of an evaluation by 11/26/09.  First appt with Rea is 3/10.  She said she would forward that information to the Ecologist. Initial call taken by: Spero Geralds PsyD,  November 20, 2009 3:27 PM     Appended Document: Short term disability form request Have been unable to contact patient to obtain information to complete forms at either phone number on file. Completion of forms will have to therefore wait until next appointment on 3/11. Call placed to [facility] and voicemail left.

## 2010-10-27 NOTE — Letter (Signed)
Summary: Probation Letter  [facility]  [address]   [place], [place] [address]   Phone: [phone]  Fax: [phone]    01/01/2010  Katie Obrien [address] [place], [place]  [address]  Dear [name],  With the goal of better serving all our patients the [facility] is following each patient's missed appointments.  You have missed at least 3 appointments with our practice.If you cannot keep your appointment, we expect you to call at least 24 hours before your appointment time.  Missing appointments prevents other patients from seeing [place] and makes it difficult to provide you with the best possible medical care.      1.   If you miss one more appointment, we will only give you limited medical services. This means we will not call in medication refills, complete a form, or make a referral for you except when you are here for a scheduled office visit.    2.   If you miss 2 or more appointments in the next year, we will dismiss you from our practice.    Our office staff can be reached at [phone] Monday through Friday from 8:30 a.m.-5:00 p.m. and will be glad to schedule your appointment as necessary.    Thank you.   [facility]  Appended Document: Probation Letter cert letter returned

## 2010-10-27 NOTE — Progress Notes (Signed)
Summary: meds prob  Phone Note Call from Patient Call back at [phone]   Caller: Patient Summary of Call: was given a presciption for xanax and has questions - upped the dosage Initial call taken by: De Nurse,  January 23, 2010 10:49 AM  Follow-up for Phone Call        states md changed her rx to 1 two times a day. there is no documentation of this. she states he told her that. he will be back Monday & I will ask him about dosing. pt has appt with him 01/29/10. told her if he changes it I will call her monday, if not, dicuss it at appt  Follow-up by: Golden Circle RN,  January 23, 2010 11:08 AM  Additional Follow-up for Phone Call Additional follow up Details #1::        Patient continues to miss scheduled follow up appointments. Will discuss at next appointment.  Additional Follow-up by: Bobby Rumpf  MD,  Jan 30, 2010 7:40 PM

## 2010-10-27 NOTE — Progress Notes (Signed)
Summary: phn msg  Phone Note Call from Patient Call back at Home Phone [phone]   Caller: Patient Summary of Call: needs to talk to nurse about an appt last week  re: 06/11/00 Candee Furbish Initial call taken by: De Nurse,  January 09, 2010 2:14 PM  Follow-up for Phone Call        mother is calling about her son that has not been seen in  this office since 2006.  he has been seen at [facility] Health in [facility] but has not seen a medical doctor since his last visit here. she missed an appointment with mental health  last week and child is out of clonodine now. he has been on this since he was 35 years old. she has call mental health and they will not fill   because he missed appointment on Friday. consulted  Dr. Leveda Anna  and he advises unfortunately there is nothing we can do at this point to help. explained to mother that child needs regular check ups  with medical doctor at least on a yearly basis. Follow-up by: Theresia Lo RN,  January 09, 2010 2:51 PM

## 2010-10-27 NOTE — Progress Notes (Signed)
Summary: Otho Perl  Phone Note Call from Patient Call back at [phone]    Caller: Patient Summary of Call: Pt wondering about FMLA papers that she brought in.  Says they need to be done, or they will terminate her.  Does Dr. Wallene Huh still have paperwork.  Has to cancel appt today due to papers not being in. Initial call taken by: Clydell Hakim,  Jan 29, 2010 10:36 AM  Follow-up for Phone Call        I had tried to contact [name] in order to obtain information for her paperwork as per my prior note in February but was unable to reach her at the numbers provided on file. She has missed multiple appointments with me since then; I had hoped to be able to see her in person regarding filling out this paperwork. She has been seen twice for work in appointments for migraines since that time, but I have not seen her. I will fill out the paperwork with the information that I have and send it in.  Follow-up by: Bobby Rumpf  MD,  Jan 30, 2010 7:20 PM     Appended Document: Otho Perl Faxed FMLA Papers on 02/02/10.

## 2010-10-27 NOTE — Progress Notes (Signed)
Summary: Schedule Initial Beh-med  Phone Note Call from Patient   Caller: Patient Call For: Spero Geralds, Psy.D. Summary of Call: Patient called to schedule an appt.  Reviewed Dr. Jeanice Lim note.  [facility] like [facility].  First available is 3/30.  Patient has [facility] and can probably see a psychiatrist (or NP) in the community.  Recommended [facility].  She agreed to call there.  I scheduled her for beh-med for 3/10 at 2:00 (my first available).  Encouraged her to call back on Monday to get back in with Dr. Wallene Huh in the interim.  She voiced an understanding.  She is to call me back if she has any trouble with the referral to the psychiatrist office. Initial call taken by: Spero Geralds PsyD,  November 14, 2009 5:06 PM

## 2010-10-29 NOTE — Progress Notes (Signed)
Summary: Rx  Phone Note Refill Request Call back at [phone]     Dosage confirmed as above?Dosage Confirmed pt prescribed xanex a few months back, is asking for refill.   Initial call taken by: Knox Royalty,  October 12, 2010 11:08 AM  Follow-up for Phone Call        please advise patient that I am unable to refill this mediation at this time. if needed I will call patient to discuss why (she had an intentional overdose on this medication three or four months ago Follow-up by: Bobby Rumpf  MD,  October 12, 2010 12:34 PM  Additional Follow-up for Phone Call Additional follow up Details #1::        Discussed with patient that I did not feel comfortable giving prescription for Xanax at this time given recent intentional overdose. Advised that I will have her increase her dose of atenolol to 100 mg with her hyperthyoidsm to see if that helps her symptoms. Patient expressed understanding.   Additional Follow-up by: Bobby Rumpf  MD,  October 13, 2010 7:10 PM

## 2010-10-29 NOTE — Progress Notes (Signed)
Summary: Increase dose of atenolol to BID   Phone Note Refill Request Call back at [phone]   Refills Requested: Medication #1:  M Ms. Katie Obrien is needing the rx to be changed since she is taking double dose she was orginally prescribed.  Provider increased and this leaves her with less for the month than before.  Please contact [facility] on [address] for refill.  Pt only have a couple days worth left.  Initial call taken by: Abundio Miu,  October 23, 2010 10:08 AM    New/Updated Medications: ATENOLOL 50 MG TABS (ATENOLOL) one tab by mouth two times a day Prescriptions: ATENOLOL 50 MG TABS (ATENOLOL) one tab by mouth two times a day  #60 x 3   Entered and Authorized by:   Bobby Rumpf  MD   Signed by:   Bobby Rumpf  MD on 10/23/2010   Method used:   Electronically to        [facility] (retail)       [address]       [place], [place]  [address]       Ph: [phone]       Fax: [phone]   RxID:   [id number]  Please let know that script is at pharmacy for pickup. Thanks! Katie Nielsen  MD  October 23, 2010 10:17 AM

## 2010-12-09 LAB — DIFFERENTIAL
Basophils Absolute: 0.2 10*3/uL — ABNORMAL HIGH (ref 0.0–0.1)
Basophils Absolute: 0.1 10*3/uL (ref 0.0–0.1)
Basophils Relative: 3 % — ABNORMAL HIGH (ref 0–1)
Basophils Relative: 1 % (ref 0–1)
Eosinophils Absolute: 0 10*3/uL (ref 0.0–0.7)
Eosinophils Relative: 1 % (ref 0–5)
Lymphocytes Relative: 34 % (ref 12–46)
Lymphocytes Relative: 37 % (ref 12–46)
Lymphs Abs: 2.6 10*3/uL (ref 0.7–4.0)
Monocytes Absolute: 0.5 10*3/uL (ref 0.1–1.0)
Monocytes Absolute: 0.7 10*3/uL (ref 0.1–1.0)
Neutro Abs: 3.6 10*3/uL (ref 1.7–7.7)
Neutro Abs: 3.5 10*3/uL (ref 1.7–7.7)

## 2010-12-09 LAB — RAPID URINE DRUG SCREEN, HOSP PERFORMED
Amphetamines: NOT DETECTED
Amphetamines: NOT DETECTED
Barbiturates: NOT DETECTED
Barbiturates: NOT DETECTED
Cocaine: NOT DETECTED
Cocaine: NOT DETECTED
Opiates: NOT DETECTED

## 2010-12-09 LAB — URINALYSIS, ROUTINE W REFLEX MICROSCOPIC
Bilirubin Urine: NEGATIVE
Hgb urine dipstick: NEGATIVE
Hgb urine dipstick: NEGATIVE
Ketones, ur: NEGATIVE mg/dL
Nitrite: NEGATIVE
Protein, ur: NEGATIVE mg/dL
Specific Gravity, Urine: 1.01 (ref 1.005–1.030)
pH: 7 (ref 5.0–8.0)

## 2010-12-09 LAB — URINE CULTURE
Colony Count: NO GROWTH
Culture  Setup Time: 201110130040

## 2010-12-09 LAB — COMPREHENSIVE METABOLIC PANEL
Alkaline Phosphatase: 54 U/L (ref 39–117)
BUN: 7 mg/dL (ref 6–23)
Calcium: 9.2 mg/dL (ref 8.4–10.5)
Chloride: 105 mEq/L (ref 96–112)
Creatinine, Ser: 0.55 mg/dL (ref 0.4–1.2)
GFR calc Af Amer: >60 mL/min (ref >60)
GFR calc non Af Amer: >60 mL/min (ref >60)
Potassium: 4.1 mEq/L (ref 3.5–5.1)
Sodium: 137 mEq/L (ref 135–145)
Total Protein: 7.2 g/dL (ref 6.0–8.3)

## 2010-12-09 LAB — CBC
HCT: 32.1 % — ABNORMAL LOW (ref 36.0–46.0)
MCH: 23.9 pg — ABNORMAL LOW (ref 26.0–34.0)
MCV: 73.2 fL — ABNORMAL LOW (ref 78.0–100.0)
Platelets: 321 10*3/uL (ref 150–400)
RDW: 17.8 % — ABNORMAL HIGH (ref 11.5–15.5)
WBC: 6.9 10*3/uL (ref 4.0–10.5)

## 2010-12-09 LAB — POCT I-STAT, CHEM 8
Creatinine, Ser: 0.6 mg/dL (ref 0.4–1.2)
Glucose, Bld: 84 mg/dL (ref 70–99)
Hemoglobin: 11.9 g/dL — ABNORMAL LOW (ref 12.0–15.0)
Sodium: 140 mEq/L (ref 135–145)
TCO2: 25 mmol/L (ref 0–100)

## 2010-12-09 LAB — ETHANOL: Alcohol, Ethyl (B): 7 mg/dL (ref 0–10)

## 2010-12-09 LAB — ACETAMINOPHEN LEVEL: Acetaminophen (Tylenol), Serum: <10 ug/mL — ABNORMAL LOW (ref 10–30)

## 2010-12-13 LAB — DIFFERENTIAL
Basophils Absolute: 0.3 10*3/uL — ABNORMAL HIGH (ref 0.0–0.1)
Eosinophils Relative: 0 % (ref 0–5)
Lymphocytes Relative: 10 % — ABNORMAL LOW (ref 12–46)
Monocytes Absolute: 1.8 10*3/uL — ABNORMAL HIGH (ref 0.1–1.0)
Monocytes Relative: 9 % (ref 3–12)

## 2010-12-13 LAB — BASIC METABOLIC PANEL
BUN: 4 mg/dL — ABNORMAL LOW (ref 6–23)
BUN: 5 mg/dL — ABNORMAL LOW (ref 6–23)
CO2: 21 mEq/L (ref 19–32)
Calcium: 8.5 mg/dL (ref 8.4–10.5)
Calcium: 8.5 mg/dL (ref 8.4–10.5)
Creatinine, Ser: 0.5 mg/dL (ref 0.4–1.2)
GFR calc Af Amer: >60 mL/min (ref >60)
GFR calc Af Amer: >60 mL/min (ref >60)
GFR calc Af Amer: >60 mL/min (ref >60)
GFR calc non Af Amer: >60 mL/min (ref >60)
GFR calc non Af Amer: >60 mL/min (ref >60)
GFR calc non Af Amer: >60 mL/min (ref >60)
GFR calc non Af Amer: >60 mL/min (ref >60)
GFR calc non Af Amer: >60 mL/min (ref >60)
Glucose, Bld: 94 mg/dL (ref 70–99)
Potassium: 3.3 mEq/L — ABNORMAL LOW (ref 3.5–5.1)
Potassium: 3.3 mEq/L — ABNORMAL LOW (ref 3.5–5.1)
Potassium: 3.4 mEq/L — ABNORMAL LOW (ref 3.5–5.1)
Potassium: 3.5 mEq/L (ref 3.5–5.1)
Sodium: 135 mEq/L (ref 135–145)
Sodium: 137 mEq/L (ref 135–145)
Sodium: 138 mEq/L (ref 135–145)
Sodium: 141 mEq/L (ref 135–145)

## 2010-12-13 LAB — CBC
HCT: 26 % — ABNORMAL LOW (ref 36.0–46.0)
HCT: 27.6 % — ABNORMAL LOW (ref 36.0–46.0)
HCT: 27.9 % — ABNORMAL LOW (ref 36.0–46.0)
HCT: 28.4 % — ABNORMAL LOW (ref 36.0–46.0)
Hemoglobin: 11.9 g/dL — ABNORMAL LOW (ref 12.0–15.0)
Hemoglobin: 8.4 g/dL — ABNORMAL LOW (ref 12.0–15.0)
Hemoglobin: 8.6 g/dL — ABNORMAL LOW (ref 12.0–15.0)
Hemoglobin: 9 g/dL — ABNORMAL LOW (ref 12.0–15.0)
MCH: 24.3 pg — ABNORMAL LOW (ref 26.0–34.0)
MCH: 24.9 pg — ABNORMAL LOW (ref 26.0–34.0)
MCHC: 32 g/dL (ref 30.0–36.0)
MCHC: 32.3 g/dL (ref 30.0–36.0)
MCHC: 33.2 g/dL (ref 30.0–36.0)
MCHC: 33.2 g/dL (ref 30.0–36.0)
MCV: 75.1 fL — ABNORMAL LOW (ref 78.0–100.0)
Platelets: 181 10*3/uL (ref 150–400)
Platelets: 316 10*3/uL (ref 150–400)
RBC: 3.45 MIL/uL — ABNORMAL LOW (ref 3.87–5.11)
RBC: 3.47 MIL/uL — ABNORMAL LOW (ref 3.87–5.11)
RBC: 4.89 MIL/uL (ref 3.87–5.11)
RDW: 20 % — ABNORMAL HIGH (ref 11.5–15.5)
RDW: 20 % — ABNORMAL HIGH (ref 11.5–15.5)
RDW: 20.7 % — ABNORMAL HIGH (ref 11.5–15.5)
WBC: 14.7 10*3/uL — ABNORMAL HIGH (ref 4.0–10.5)
WBC: 20.1 10*3/uL — ABNORMAL HIGH (ref 4.0–10.5)
WBC: 6 10*3/uL (ref 4.0–10.5)
WBC: 7.8 10*3/uL (ref 4.0–10.5)
WBC: 8 10*3/uL (ref 4.0–10.5)
WBC: 8.1 10*3/uL (ref 4.0–10.5)

## 2010-12-13 LAB — URINE CULTURE: Colony Count: 1000

## 2010-12-13 LAB — URINALYSIS, ROUTINE W REFLEX MICROSCOPIC
Protein, ur: 100 mg/dL — AB
Urobilinogen, UA: 1 mg/dL (ref 0.0–1.0)

## 2010-12-13 LAB — COMPREHENSIVE METABOLIC PANEL
ALT: 18 U/L (ref 0–35)
AST: 24 U/L (ref 0–37)
Albumin: 3.7 g/dL (ref 3.5–5.2)
Alkaline Phosphatase: 66 U/L (ref 39–117)
BUN: 9 mg/dL (ref 6–23)
CO2: 21 mEq/L (ref 19–32)
GFR calc non Af Amer: >60 mL/min (ref >60)
Total Bilirubin: 0.5 mg/dL (ref 0.3–1.2)

## 2010-12-13 LAB — LIPASE, BLOOD: Lipase: 20 U/L (ref 11–59)

## 2010-12-13 LAB — T4, FREE: Free T4: 1.91 ng/dL — ABNORMAL HIGH (ref 0.80–1.80)

## 2010-12-13 LAB — GLUCOSE, CAPILLARY: Glucose-Capillary: 124 mg/dL — ABNORMAL HIGH (ref 70–99)

## 2010-12-13 LAB — MRSA PCR SCREENING: MRSA by PCR: NEGATIVE

## 2010-12-13 LAB — CULTURE, BLOOD (ROUTINE X 2)
Culture: NO GROWTH
Culture: NO GROWTH

## 2010-12-13 LAB — T3, FREE: T3, Free: 3.5 pg/mL (ref 2.3–4.2)

## 2010-12-13 LAB — LACTIC ACID, PLASMA: Lactic Acid, Venous: 1.3 mmol/L (ref 0.5–2.2)

## 2010-12-13 LAB — URINE MICROSCOPIC-ADD ON

## 2010-12-16 ENCOUNTER — Encounter: Payer: Self-pay | Admitting: Family Medicine

## 2010-12-16 ENCOUNTER — Ambulatory Visit (INDEPENDENT_AMBULATORY_CARE_PROVIDER_SITE_OTHER): Payer: Self-pay | Admitting: Family Medicine

## 2010-12-16 DIAGNOSIS — G43909 Migraine, unspecified, not intractable, without status migrainosus: Secondary | ICD-10-CM

## 2010-12-16 DIAGNOSIS — E0500 Thyrotoxicosis with diffuse goiter without thyrotoxic crisis or storm: Secondary | ICD-10-CM

## 2010-12-16 MED ORDER — DEXAMETHASONE SODIUM PHOSPHATE 10 MG/ML IJ SOLN
10.0000 mg | Freq: Once | INTRAMUSCULAR | Status: AC
  Administered 2010-12-16: 10 mg via INTRAMUSCULAR

## 2010-12-16 MED ORDER — DIPHENHYDRAMINE HCL 50 MG/ML IJ SOLN
50.0000 mg | Freq: Once | INTRAMUSCULAR | Status: AC
  Administered 2010-12-16: 50 mg via INTRAMUSCULAR

## 2010-12-16 MED ORDER — PROMETHAZINE HCL 25 MG/ML IJ SOLN
12.5000 mg | Freq: Once | INTRAMUSCULAR | Status: AC
  Administered 2010-12-16: 12.5 mg via INTRAMUSCULAR

## 2010-12-16 NOTE — Assessment & Plan Note (Signed)
Patient currently awaiting Endocrine. Rx Atenolol. No proptosis or tachycardia today.

## 2010-12-16 NOTE — Progress Notes (Signed)
  Subjective:    Katie Obrien is a 35 y.o. female who presents for evaluation of headache. Symptoms began about a few days ago. Generally, the headaches last about a few hours and occur continuously. The headaches do not seem to be related to any time of the day. The headaches are usually pounding and are located in right temple and back of head.  The patient rates her most severe headaches a 8 on a scale from 1 to 10. Recently, the headaches have been stable. Work attendance or other daily activities are affected by the headaches. Precipitating factors include: stress. The headaches are usually not preceded by an aura. Associated neurologic symptoms: vision problems. She endorses phonophobia and photophobia as well as nausea. The patient denies dizziness, numbness of extremities, speech difficulties and vomiting in the early morning. Home treatment has included acetaminophen and ibuprofen with no improvement. Other history includes: migraine headaches diagnosed in the past. Family history includes no known family members with significant headaches.  The following portions of the patient's history were reviewed and updated as appropriate: allergies, current medications, past family history, past medical history, past social history, past surgical history and problem list.  Review of Systems Pertinent items are noted in HPI.    Objective:    There were no vitals taken for this visit. General appearance: alert, cooperative and mild distress Head: Normocephalic, without obvious abnormality, atraumatic Eyes: conjunctivae/corneas clear. PERRL, EOM's intact. Fundi benign. Neck: thyroid: enlarged Lungs: clear to auscultation bilaterally Heart: regular rate and rhythm, S1, S2 normal, no murmur, click, rub or gallop Pulses: 2+ and symmetric Neurologic: Alert and oriented X 3, normal strength and tone. Normal symmetric reflexes. Normal coordination and gait    Assessment:    Common migraine      Plan:    Lie in darkened room and apply cold packs as needed for pain. Side effect profile discussed in detail. Patient reassured that neurodiagnostic workup not indicated from benign H&P.

## 2010-12-16 NOTE — Assessment & Plan Note (Signed)
Acute persistent migraine.  Tx plan: Benadryl 50mg by mouth x 1, Dexamethasone 10mg IM x1, and Compazine 10mg IM x 1. Advised patient not to drive.  Her boyfriend is going to drive her home.  [facility] but cannot afford to return.  She is on Atenolol, but may benefit from further migraine prophylaxis.  Pt observed 15 minutes s/p injection and instructed to f/u if symptoms worsen or not improved.  

## 2010-12-28 ENCOUNTER — Telehealth: Payer: Self-pay | Admitting: *Deleted

## 2010-12-28 NOTE — Telephone Encounter (Signed)
LMOVM for pt to return call. Pt requested her shot records.  Angelique Blonder had the chart pulled but unfortunately pt started coming to [place] when she was 17 and we do not have a copy of her shot records.  I would suggest speaking with her elementary school.

## 2011-01-06 LAB — URINALYSIS, ROUTINE W REFLEX MICROSCOPIC
Glucose, UA: NEGATIVE mg/dL
Hgb urine dipstick: NEGATIVE
Leukocytes, UA: NEGATIVE
Specific Gravity, Urine: 1.015 (ref 1.005–1.030)
Urobilinogen, UA: 0.2 mg/dL (ref 0.0–1.0)

## 2011-01-06 LAB — URINE MICROSCOPIC-ADD ON

## 2011-01-06 LAB — POCT I-STAT, CHEM 8
BUN: 7 mg/dL (ref 6–23)
Calcium, Ion: 1.13 mmol/L (ref 1.12–1.32)
Glucose, Bld: 89 mg/dL (ref 70–99)
TCO2: 20 mmol/L (ref 0–100)

## 2011-01-06 LAB — PREGNANCY, URINE: Preg Test, Ur: NEGATIVE

## 2011-02-12 NOTE — Discharge Summary (Signed)
[facility]  Patient:    Katie Obrien, Katie Obrien                    MRN: [id number] Adm. Date:  [id number] Disc. Date: [id number] Attending:  Antionette Char Dictator:   Nicoletta Ba, M.D.                           Discharge Summary  ADMISSION DIAGNOSES:          1. Dehydration.                               2. Urinary tract infection.  DISCHARGE DIAGNOSES:          1. Dehydration.                               2. Urinary tract infection.  CONSULTATIONS:                None.  PROCEDURES PERFORMED:         None.  HOSPITAL COURSE:              The patient is a 35 year old G3, P1-0-1-1 at 37-4/7 weeks by last menstrual period, which corresponds with a 19 week ultrasound.  She presented with a three-day history of vomiting, dizziness and light-headedness.  The patient was afebrile and vital signs were stable.  A urinalysis showed positive nitrite, negative leukocytes, 15 ketones, specific gravity 1.025 and positive bilirubin.  Microscopic analysis showed 0-5 white blood cell per high power field, 0-5 red blood cells per high power field and few bacteria.  A urine sample was sent for culture, and this is pending upon discharge.  The patient was admitted for rehydration and IV Cefotan.  She remained afebrile throughout the hospital course.  Her symptoms of nausea, vomiting and dizziness subsided quickly.  Evaluation of the fetus revealed fetal heart tones of 135-145 and reactive.  She did not have any uterine contractions during this hospital stay.  On the second hospital day, she reported feeling well and had the desire to go home.  She had been afebrile and vital signs had been stable.  CONDITION ON DISCHARGE:       Good.  DISCHARGE MEDICATIONS:        She was discharged to home with prescriptions for Ceftin 500 mg b.i.d. to take for 12 days to finish a 14-day course of antibiotics.  DISCHARGE INSTRUCTIONS:       She was instructed to call [facility], where she receives her prenatal care, to set up a follow up appointment.  She was not told to restrict activity or diet.  She was told to return for any loss of fluid, vaginal bleeding, contractions every five minutes for greater than one hour or any return of fever, flank pain or difficulty urinating or any other concerns.  The patient was discharged to home. DD:  05/21/00 TD:  05/23/00 Job: [id number]

## 2011-02-12 NOTE — Discharge Summary (Signed)
[facility]  Patient:    Katie Obrien, Katie Obrien                    MRN: [id number] Adm. Date:  [id number] Disc. Date: [id number] Attending:  Antionette Char Dictator:   Ocie Doyne, M.D.                           Discharge Summary  DATE OF BIRTH:                13-Jul-1976  DISCHARGE DIAGNOSES:          1. Low transverse cesarean section with delivery                                  of a single, liveborn infant.                               2. Bilateral tubal ligation.  DISCHARGE MEDICATIONS:        Ibuprofen, Percocet, and prenatal vitamins.  PROCEDURES:                   Low transverse cesarean section.  ADMISSION HISTORY AND PHYSICAL:                     This 35 year old, G3, P1-0-1-0, at 40-6/7 weeks by 19-week ultrasound presented to maternity admissions for induction secondary to post dates.  She stated that she was having contractions, some mucusy vaginal discharge, and could feel the baby move.  Denied spontaneous rupture of membranes or any bleeding from the vagina.  PAST MEDICAL HISTORY:         Significant for a history of depression and a previous low transverse C-section in November of 1998, delivered of a post dates female at 42-1/2 weeks secondary to fetal bradycardia.  SOCIAL HISTORY:               No alcohol.  No tobacco.  MEDICATIONS:                  Prenatal vitamins.  ALLERGIES:                    No known drug allergies.  ADMISSION PHYSICAL EXAMINATION:                  Temperature 98.1 degrees, pulse 78, respirations 20, blood pressure 127/77, fetal heart rate 130-140 with accelerations and mild variable decelerations and uterine irritability.  CHEST:                        Clear to auscultation bilaterally.  HEART:                        Regular rate and rhythm.  ABDOMEN:                      Gravid and nontender.  PELVIC:                       The cervix was 1 cm, 60% effaced, and vertex.  ADMISSION LABORATORIES:        GBS negative.  GC and chlamydia negative. Hepatitis B surface antigen negative.  VDRL negative.  Sickle  cell negative. AFP within normal limits.  Blood type A+.  Rubella immune.  HOSPITAL COURSE:              Cervidil was placed to ripen the cervix.  Labor was induced.  During the night, the Cervidil fell out prematurely.  She was started on low-dose Pitocin per protocol.  She spontaneously ruptured her membranes.  The amniotic fluid was clear.  She did have some mild variable decelerations which resolved with positioning and amnioinfusion.  After laboring for 12 hours with a prolonged active phase, but periods of inadequate uterine contractions and no descent with persistent fetal heart tone variability, she was scheduled for a cesarean section.  She underwent a repeat low transverse cesarean section and bilateral tubal ligation on June 11, 2000, without complications.  On postoperative day #1, she was passing flatus with minimal bleeding.  By postoperative day #2, her pain was adequately controlled on p.o. medications and she began ambulating.  She was discharged home on postoperative day #3.  At that time, she was passing flatus, had not yet had a bowel movement, and was ambulating without difficulty.  She elected to bottle feed the infant.  Her condition on discharge was stable.  She was scheduled for follow-up in one week to have incisional staples removed and follow up in six weeks at [facility] for a recheck. DD:  08/10/00 TD:  08/10/00 Job: [id number]

## 2011-02-12 NOTE — Op Note (Signed)
[facility]  Patient:    Katie Obrien, Katie Obrien                    MRN: [id number] Proc. Date: 06/11/00 Adm. Date:  [id number] Attending:  Antionette Char                           Operative Report  PREOPERATIVE DIAGNOSES:       1. Forty-one weeks gestation.                               2. Previous cesarean section.                               3. Arrest of descent.                               4. Desires sterilization.  POSTOPERATIVE DIAGNOSES:      1. Forty-one week gestation.                               2. Previous cesarean section.                               3. Arrest of descent.                               4. Desires sterilization.  OPERATION:                    Repeat low transverse cesarean section and                               bilateral partial salpingectomy ([place]                               technique).  SURGEON:                      Charles A. Clearance Coots, M.D.  ASSISTANT:                    Marvis Moeller, C.N.M.  ANESTHESIA:                   Epidural.  ESTIMATED BLOOD LOSS:         800 ml.  IV FLUIDS:                    1600 ml.  URINE OUT:                    1500 ml, clear.  COMPLICATIONS:                None.  FOLEY:                        To gravity.  FINDINGS:                     A viable female at 2030.  Apgars  at 8 at one minute and 9 at five minutes.  Weight of 6 pounds and 10 ounces.  Cord pH of 7.32.  Normal uterus, ovaries, and fallopian tubes.  SPECIMEN:                     Approximately 2 cm segments of right and left fallopian tubes.  DESCRIPTION OF PROCEDURE:     The patient was brought to the operating room and after satisfactory redosing of the epidural, the abdomen was prepped and draped in the usual sterile fashion.  A Pfannenstiel skin incision was made with the scalpel through the previous scar down to the fascia.  The fascia was nicked in the midline and the fascial incision was extended to  the left and to the right with curved Mayo scissors.  The superior and inferior fascial edges were taken off of the rectus muscle with both blunt and sharp dissection.  The rectus muscle was divided in the midline superiorly and inferiorly with sharp and blunt dissection.  The peritoneum was entered digitally and was digitally extended to the left and to the right.  The bladder blade was positioned and the vesicouterine fold and peritoneum above the reflection of the urinary bladder was grasped with forceps, and was incised and undermined with Metzenbaum scissors.  The incision was extended to the left and to the right with the Metzenbaum scissors.  The incision was extended to the left and to the right with the Metzenbaum scissors.  The bladder flap was bluntly developed, and the bladder blade was repositioned in front of the urinary bladder, placing it well out of the operative field.  The uterus was entered with sharp strokes of the scalpel.  Clear amniotic fluid was expelled.  The low transverse uterine incision was then extended to the left and to the right with curved Mayo scissors in a smile configuration.  The vertex was delivered with the aid of fundal pressure from the assistant.  The infants mouth and nose were suctioned with a suction bulb and delivery was completed with the aid of fundal pressure from the assistant.                                The umbilical cord was clamped.  It was cut and the infant was handed off the nursing staff.  Cord pH and cord blood were obtained and the placenta was spontaneously expelled from the uterine cavity intact.  The uterus was exteriorized and the endometrial surface was thoroughly debrided with a dry lap sponge.  The edges of the uterine incision were grasped with the ring forceps and the uterus was closed with a continuous interlocking suture of 0 Monocryl from each corner to the center.  Hemostasis was excellent.                                 Attention was then turned above the tubal ligation procedure.  The right fallopian tube was grasped in the isthmic area of the tube with a Babcock clamp and knuckle of tube beneath the Babcock clamp was doubly ligated with #1 plain catgut and the section of tube above the knot was excised and submitted to pathology for evaluation.  There was no active bleeding from the tubal stump.  The same procedure was performed on the opposite side without complication.  The uterus  was then placed back in its normal anatomic position and the pelvic cavity was thoroughly irrigated with warm saline solution and all clots were removed.                                The closure of the uterus was again observed for hemostasis and there was no active bleeding noted.  The abdomen was then closed as follows.  The rectus muscle was approximated with a few interrupted sutures of 2-0 Monocryl.  The fascia was closed with a continuous suture of 0 PDS from each corner to the center.  The subcutaneous tissue was thoroughly irrigated with warm saline solution and all areas of subcutaneous bleeding were coagulated with the Bovie.  The skin was then approximated with surgical stainless steel staples.  A sterile bandage was applied to the incision closure.  The surgical technician indicated that all needle, sponge, and instrument counts were correct.                                The patient tolerated the procedure well and was transported to the recovery room in satisfactory condition. DD:  06/11/00 TD:  06/13/00 Job: [id number]

## 2011-05-18 ENCOUNTER — Other Ambulatory Visit: Payer: Self-pay | Admitting: Family Medicine

## 2011-05-18 NOTE — Telephone Encounter (Signed)
Refill request

## 2011-06-28 ENCOUNTER — Other Ambulatory Visit: Payer: Self-pay | Admitting: Family Medicine

## 2011-06-28 ENCOUNTER — Ambulatory Visit (INDEPENDENT_AMBULATORY_CARE_PROVIDER_SITE_OTHER): Payer: Self-pay | Admitting: Family Medicine

## 2011-06-28 ENCOUNTER — Encounter: Payer: Self-pay | Admitting: Family Medicine

## 2011-06-28 DIAGNOSIS — N939 Abnormal uterine and vaginal bleeding, unspecified: Secondary | ICD-10-CM | POA: Insufficient documentation

## 2011-06-28 DIAGNOSIS — E041 Nontoxic single thyroid nodule: Secondary | ICD-10-CM

## 2011-06-28 DIAGNOSIS — N920 Excessive and frequent menstruation with regular cycle: Secondary | ICD-10-CM

## 2011-06-28 DIAGNOSIS — E0500 Thyrotoxicosis with diffuse goiter without thyrotoxic crisis or storm: Secondary | ICD-10-CM

## 2011-06-28 LAB — COMPREHENSIVE METABOLIC PANEL
Alkaline Phosphatase: 47 U/L (ref 39–117)
BUN: 14 mg/dL (ref 6–23)
CO2: 22 mEq/L (ref 19–32)
Creat: 0.43 mg/dL — ABNORMAL LOW (ref 0.50–1.10)
Glucose, Bld: 85 mg/dL (ref 70–99)
Sodium: 137 mEq/L (ref 135–145)
Total Bilirubin: 0.3 mg/dL (ref 0.3–1.2)

## 2011-06-28 MED ORDER — PROMETHAZINE HCL 25 MG PO TABS: 25.0000 mg | ORAL_TABLET | Freq: Four times a day (QID) | ORAL | Status: DC | PRN

## 2011-06-28 MED ORDER — NORGESTIMATE-ETH ESTRADIOL 0.25-35 MG-MCG PO TABS: ORAL_TABLET | ORAL | Status: DC

## 2011-06-28 NOTE — Telephone Encounter (Signed)
Refill request

## 2011-06-28 NOTE — Assessment & Plan Note (Addendum)
Enlarging, untreated nodule.  Repeat u/s to order next week after gets paperwork in.  Debra hill paperwork to help start process to treat.

## 2011-06-28 NOTE — Assessment & Plan Note (Signed)
3 weeks of bleeding.  Will treat with 3 pills/day of OCPs x 7 days.  See back 1 week.  Check CBC

## 2011-06-28 NOTE — Progress Notes (Signed)
  Subjective:    Patient ID: Katie Obrien, female    DOB: April 01, 1976, 35 y.o.   MRN: [id number]  HPI Pt with 1 year hx of enlarging thyroid that has been untreated because pt is uninsured.  She has not followed up in that time to avoid doctor's bills.  She has not seen [facility].  Pt had thyroid u/s, uptake scan, and biopsy 1 year ago.  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  Pt has been taking atenolol 100mg  for symptom control.  She has not seen endocrine or surgery due to finance.  Today pt is complaining of 3 months of continuous menstrual bleeding with cramping.  She had a BTL years ago.  She has no hew partners.  She is drinking lots of caffiene (5 red bulls a day) because she feels fatigued.  She states that the swelling in her throat is causing her to have some trouble swallowing.  Denies CP, SOB.  Denies diarrhea.     Review of Systems No N/V/D    Objective:   Physical Exam Vital signs reviewed General appearance - alert, well appearing, and in no distress and oriented to person, place, and time Eyes - pupils equal and reactive, extraocular eye movements intact, sclera anicteric--no exopthalmos Neck- thyroid with right sided egg sized nodule, smooth Heart - normal rate, regular rhythm, normal S1, S2, no murmurs, rubs, clicks or gallops Chest - clear to auscultation, no wheezes, rales or rhonchi, symmetric air entry, no tachypnea, retractions or cyanosis         Assessment & Plan:

## 2011-06-28 NOTE — Patient Instructions (Addendum)
Please fill out your Katie Obrien paperwork  Please take 3 sprintec pills per day for 7 days  Take phenergan as needed for any nausea  Come back in 1 week to see Dr. Katrinka Blazing to continue working up your thyroid

## 2011-06-28 NOTE — Assessment & Plan Note (Signed)
Worsening symptoms, recheck cbc cmet and tsh with T4.  See back in 1 week to start methimazole  With PCP.

## 2011-06-29 LAB — CBC
HCT: 35.5 % — ABNORMAL LOW (ref 36.0–46.0)
MCH: 24.1 pg — ABNORMAL LOW (ref 26.0–34.0)
MCHC: 31.3 g/dL (ref 30.0–36.0)
MCV: 77 fL — ABNORMAL LOW (ref 78.0–100.0)
Platelets: 159 10*3/uL (ref 150–400)
RDW: 17.5 % — ABNORMAL HIGH (ref 11.5–15.5)
WBC: 4.6 10*3/uL (ref 4.0–10.5)

## 2011-06-29 LAB — T4, FREE: Free T4: 3.38 ng/dL — ABNORMAL HIGH (ref 0.80–1.80)

## 2011-07-06 ENCOUNTER — Ambulatory Visit (INDEPENDENT_AMBULATORY_CARE_PROVIDER_SITE_OTHER): Payer: Self-pay | Admitting: Family Medicine

## 2011-07-06 ENCOUNTER — Encounter: Payer: Self-pay | Admitting: Family Medicine

## 2011-07-06 VITALS — BP 135/85 | HR 85 | Temp 97.7°F | Wt 192.0 lb

## 2011-07-06 DIAGNOSIS — E041 Nontoxic single thyroid nodule: Secondary | ICD-10-CM

## 2011-07-06 DIAGNOSIS — E0500 Thyrotoxicosis with diffuse goiter without thyrotoxic crisis or storm: Secondary | ICD-10-CM

## 2011-07-06 MED ORDER — METHIMAZOLE 10 MG PO TABS: 10.0000 mg | ORAL_TABLET | Freq: Three times a day (TID) | ORAL | Status: DC

## 2011-07-06 NOTE — Patient Instructions (Signed)
I sent in a new medicine for you to try. Take this as well as taking atenolol. I will need you to come back in 2 weeks to recheck her thyroid levels and make sure you're doing better please call me if you have any other questions. I am also giving you set up with surgery here very soon.

## 2011-07-06 NOTE — Assessment & Plan Note (Signed)
As above will not give any further imaging at this time patient will followup with surgery on the 29th

## 2011-07-06 NOTE — Assessment & Plan Note (Signed)
At this time patient is on atenolol we will start methimazole as well 3 times daily to see if we can help patient with symptoms. Visual come back in 2 weeks for labs as well as followup Patient has surgical appointment on October 29 for evaluation for possible surgical intervention

## 2011-07-06 NOTE — Progress Notes (Signed)
  Subjective:    Patient ID: Katie Obrien, female    DOB: 08-22-1976, 35 y.o.   MRN: [id number]  HPI 35 year old female with a history of a hyperplastic thyroid nodule presents for followup. Patient was seen by Dr. Hulen Luster initially but due to patient not having any insurance at that time was unable to get ultrasound and biopsy done.  Pt has been taking atenolol 100mg  for symptom control.  She has not seen endocrine or surgery due to finance.   Patient initially presented to Dr. Hulen Luster to 2 having 3 months of continuous menstrual bleeding patient has a history of a bilateral tubal ligation patient states that this has gotten better Review of Systems Patient denies fever, chills, nausea, vomiting, abdominal pain    Objective:   Physical Exam Vital signs reviewed General appearance - alert, well appearing, and in no distress and oriented to person, place, and time patient though is noticeably anxious does have a resting tremor Eyes - pupils equal and reactive, extraocular eye movements intact, sclera anicteric--no exopthalmos Neck- thyroid with right sided egg sized nodule, smooth approximately 4-5 cm in diameter mildly tender Heart - normal rate, regular rhythm, normal S1, S2, no murmurs, rubs, clicks or gallops Chest - clear to auscultation, no wheezes, rales or rhonchi, symmetric air entry, no tachypnea, retractions or cyanosis    Assessment & Plan:

## 2011-07-12 ENCOUNTER — Telehealth: Payer: Self-pay | Admitting: Family Medicine

## 2011-07-12 NOTE — Telephone Encounter (Signed)
There is nothing less expensive, it will make her feel much better, she can call other pharmacies and see if it is cheaper somewhere else.

## 2011-07-12 NOTE — Telephone Encounter (Signed)
Patient informed, expressed understanding. 

## 2011-07-12 NOTE — Telephone Encounter (Signed)
Cannot afford Tapazole and would like something like it that is less expensive.

## 2011-07-19 ENCOUNTER — Encounter (INDEPENDENT_AMBULATORY_CARE_PROVIDER_SITE_OTHER): Payer: Self-pay | Admitting: Surgery

## 2011-07-20 ENCOUNTER — Ambulatory Visit: Payer: Self-pay | Admitting: Family Medicine

## 2011-07-23 ENCOUNTER — Other Ambulatory Visit: Payer: Self-pay | Admitting: Family Medicine

## 2011-07-26 ENCOUNTER — Encounter (INDEPENDENT_AMBULATORY_CARE_PROVIDER_SITE_OTHER): Payer: Self-pay | Admitting: Surgery

## 2011-07-26 ENCOUNTER — Ambulatory Visit (INDEPENDENT_AMBULATORY_CARE_PROVIDER_SITE_OTHER): Payer: PRIVATE HEALTH INSURANCE | Admitting: Surgery

## 2011-07-26 DIAGNOSIS — E0500 Thyrotoxicosis with diffuse goiter without thyrotoxic crisis or storm: Secondary | ICD-10-CM

## 2011-07-26 DIAGNOSIS — E041 Nontoxic single thyroid nodule: Secondary | ICD-10-CM

## 2011-07-26 NOTE — Progress Notes (Signed)
Chief Complaint  Patient presents with  . 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 Antoine Primas, [facility]    HISTORY: The patient is a 35 year old female referred by her primary care physician with thyroid nodules and hyperthyroidism. Patient was initially diagnosed approximately one year ago. She has had difficulty with tachycardia, inability to sleep, progressive enlargement of her thyroid nodules, mild dysphagia, and voice quality changes. Patient has no prior history of thyroid disease. She is currently taking a beta blocker. She was prescribed Tapazole but has not been taking that due to the cost.  The patient has a family history of thyroid disease in her mother and several maternal aunts. Her mother has hypothyroidism and takes medication. There is no family history of thyroid cancer. There is no family history of other endocrine adenopathy.  Patient has had no prior head or neck surgery other than tonsillectomy. She presents today to discuss thyroidectomy for management of an enlarging thyroid nodules and hyperthyroidism.   The patient has a recent thyroid ultrasound showing an inhomogeneous thyroid gland and consistent with thyroiditis versus Graves' disease. There are 2 dominant nodules in the right thyroid lobe, the largest measuring approximately 3.2 cm in size. Fine needle aspiration biopsy has been performed and shows benign cytopathology. Nuclear medicine scanning shows that the larger nodule on the right is cold in nature.   Past Medical History  Diagnosis Date  . Migraine   . Hyperthyroidism   . Hypertension      Current Outpatient Prescriptions  Medication Sig Dispense Refill  . atenolol (TENORMIN) 50 MG tablet take 1 tablet by mouth twice a day  60 tablet  0     Allergies  Allergen Reactions  . Codeine      Family History  Problem Relation Age of Onset  . Cancer Maternal Aunt     ovarian  . Cancer Maternal  Grandfather     bone     History   Social History  . Marital Status: Legally Separated    Spouse Name: N/A    Number of Children: N/A  . Years of Education: N/A   Social History Main Topics  . Smoking status: Current Everyday Smoker -- 1.0 packs/day  . Smokeless tobacco: None  . Alcohol Use: Yes  . Drug Use: No  . Sexually Active: Yes    Birth Control/ Protection: Surgical   Other Topics Concern  . None   Social History Narrative  . None     REVIEW OF SYSTEMS - PERTINENT POSITIVES ONLY: Patient notes tachycardia, inability to sleep, increasing size of thyroid nodule with mild dysphagia, and voice quality changes. She also notes tremor and palpitations.   EXAM: Filed Vitals:   07/26/11 1355  BP: 152/76  Pulse: 80  Temp: 97.4 F (36.3 C)  Resp: 24    HEENT: normocephalic; pupils equal and reactive; sclerae clear; dentition good; mucous membranes moist NECK:  Obvious mass in the right thyroid bed. Palpation reveals multiple right thyroid nodules which are slightly tender. Isthmus and left lobe were without palpable abnormality; asymmetric on extension; no palpable anterior or posterior cervical lymphadenopathy; no supraclavicular masses; no tenderness CHEST: clear to auscultation bilaterally without rales, rhonchi, or wheezes CARDIAC: regular rate and rhythm without significant murmur; peripheral pulses are full EXT:  non-tender without edema; no deformity NEURO: no gross focal deficits; mild tremor   LABORATORY RESULTS: See E-Chart for most recent results   RADIOLOGY RESULTS: See E-Chart  or I-Site for most recent results   IMPRESSION: #1 the dominant right thyroid nodules, cold on nuclear scanning, with benign cytopathology #2 mild compressive symptoms with dysphagia in voice quality changes #3 hyperthyroidism, possible underlying Graves' disease versus thyroiditis   PLAN: Patient and I discussed the indications for surgery. I have recommended total  thyroidectomy. We have discussed the risk and benefits of the procedure including recurrent laryngeal nerve injury and injury to parathyroid glands. We have discussed the surgical wound to be used. We discussed the hospital stay and her recovery following surgery. We have discussed lifelong thyroid hormone replacement therapy. Patient understands and wishes to proceed in the near future. We will make arrangements for surgery.  The risks and benefits of the procedure have been discussed at length with the patient.  The patient understands the proposed procedure, potential alternative treatments, and the course of recovery to be expected.  All of the patient's questions have been answered at this time.  The patient wishes to proceed with surgery and will schedule a date for their procedure through our office staff.   Velora Heckler, MD, FACS General & Endocrine Surgery [facility]      Visit Diagnoses: 1. GRAVES' DISEASE   2. THYROID NODULE, RIGHT     Primary Care Physician: SMITH,ZACHARY, DO, DO

## 2011-07-26 NOTE — Telephone Encounter (Signed)
Refill request

## 2011-07-27 ENCOUNTER — Telehealth: Payer: Self-pay | Admitting: Family Medicine

## 2011-07-27 NOTE — Telephone Encounter (Signed)
Pt is having anxiety issues and is asking for something to be called in.  She has appt here on Thurs, but feels she needs it today.  Her thyroidectomy is Saturday. [facility]- [address]

## 2011-07-28 ENCOUNTER — Emergency Department (HOSPITAL_COMMUNITY)
Admission: EM | Admit: 2011-07-28 | Discharge: 2011-07-28 | Disposition: A | Discharge status: Home / Self Care | Payer: Self-pay | Attending: Emergency Medicine | Admitting: Emergency Medicine

## 2011-07-28 DIAGNOSIS — F3289 Other specified depressive episodes: Secondary | ICD-10-CM | POA: Insufficient documentation

## 2011-07-28 DIAGNOSIS — S025XXA Fracture of tooth (traumatic), initial encounter for closed fracture: Secondary | ICD-10-CM | POA: Insufficient documentation

## 2011-07-28 DIAGNOSIS — X58XXXA Exposure to other specified factors, initial encounter: Secondary | ICD-10-CM | POA: Insufficient documentation

## 2011-07-28 DIAGNOSIS — K089 Disorder of teeth and supporting structures, unspecified: Secondary | ICD-10-CM | POA: Insufficient documentation

## 2011-07-28 DIAGNOSIS — F329 Major depressive disorder, single episode, unspecified: Secondary | ICD-10-CM | POA: Insufficient documentation

## 2011-07-28 DIAGNOSIS — K029 Dental caries, unspecified: Secondary | ICD-10-CM | POA: Insufficient documentation

## 2011-07-28 NOTE — Telephone Encounter (Signed)
I do not feel comfortable sending thing in at this time. Only medication you is hydroxyzine please call patient not gets better.  Otherwise tell her to try Benadryl and see if that helps.

## 2011-07-29 ENCOUNTER — Other Ambulatory Visit (INDEPENDENT_AMBULATORY_CARE_PROVIDER_SITE_OTHER): Payer: Self-pay | Admitting: Surgery

## 2011-07-29 ENCOUNTER — Ambulatory Visit (HOSPITAL_COMMUNITY)
Admission: RE | Admit: 2011-07-29 | Discharge: 2011-07-29 | Disposition: A | Discharge status: Home / Self Care | Payer: Self-pay | Source: Ambulatory Visit | Attending: Surgery | Admitting: Surgery

## 2011-07-29 ENCOUNTER — Ambulatory Visit (INDEPENDENT_AMBULATORY_CARE_PROVIDER_SITE_OTHER): Payer: Self-pay | Admitting: Family Medicine

## 2011-07-29 ENCOUNTER — Encounter: Payer: Self-pay | Admitting: Family Medicine

## 2011-07-29 ENCOUNTER — Encounter (HOSPITAL_COMMUNITY): Payer: Self-pay

## 2011-07-29 DIAGNOSIS — Z01818 Encounter for other preprocedural examination: Secondary | ICD-10-CM | POA: Insufficient documentation

## 2011-07-29 DIAGNOSIS — Z01810 Encounter for preprocedural cardiovascular examination: Secondary | ICD-10-CM | POA: Insufficient documentation

## 2011-07-29 DIAGNOSIS — E0510 Thyrotoxicosis with toxic single thyroid nodule without thyrotoxic crisis or storm: Secondary | ICD-10-CM | POA: Insufficient documentation

## 2011-07-29 DIAGNOSIS — R000 Tachycardia, unspecified: Secondary | ICD-10-CM | POA: Insufficient documentation

## 2011-07-29 DIAGNOSIS — E041 Nontoxic single thyroid nodule: Secondary | ICD-10-CM

## 2011-07-29 DIAGNOSIS — R0602 Shortness of breath: Secondary | ICD-10-CM | POA: Insufficient documentation

## 2011-07-29 DIAGNOSIS — Z01812 Encounter for preprocedural laboratory examination: Secondary | ICD-10-CM | POA: Insufficient documentation

## 2011-07-29 DIAGNOSIS — N920 Excessive and frequent menstruation with regular cycle: Secondary | ICD-10-CM

## 2011-07-29 LAB — URINALYSIS, ROUTINE W REFLEX MICROSCOPIC
Glucose, UA: NEGATIVE mg/dL
Ketones, ur: NEGATIVE mg/dL
Leukocytes, UA: NEGATIVE
Nitrite: NEGATIVE
Specific Gravity, Urine: 1.003 — ABNORMAL LOW (ref 1.005–1.030)
pH: 6 (ref 5.0–8.0)

## 2011-07-29 LAB — DIFFERENTIAL
Basophils Absolute: 0 10*3/uL (ref 0.0–0.1)
Basophils Relative: 0 % (ref 0–1)
Lymphocytes Relative: 39 % (ref 12–46)
Monocytes Absolute: 0.6 10*3/uL (ref 0.1–1.0)
Monocytes Relative: 11 % (ref 3–12)
Neutro Abs: 2.7 10*3/uL (ref 1.7–7.7)
Neutrophils Relative %: 48 % (ref 43–77)

## 2011-07-29 LAB — BASIC METABOLIC PANEL
BUN: 9 mg/dL (ref 6–23)
Creatinine, Ser: 0.45 mg/dL — ABNORMAL LOW (ref 0.50–1.10)
GFR calc Af Amer: >90 mL/min (ref >90)
GFR calc non Af Amer: >90 mL/min (ref >90)
Potassium: 4.4 mEq/L (ref 3.5–5.1)

## 2011-07-29 LAB — CBC
HCT: 38.3 % (ref 36.0–46.0)
HCT: 38.3 % (ref 36.0–46.0)
Hemoglobin: 12.5 g/dL (ref 12.0–15.0)
Hemoglobin: 12.5 g/dL (ref 12.0–15.0)
MCH: 24.7 pg — ABNORMAL LOW (ref 26.0–34.0)
MCHC: 32.6 g/dL (ref 30.0–36.0)
RBC: 5.06 MIL/uL (ref 3.87–5.11)
WBC: 5.6 10*3/uL (ref 4.0–10.5)

## 2011-07-29 LAB — PROTIME-INR
INR: 1.02 (ref 0.00–1.49)
Prothrombin Time: 13.6 seconds (ref 11.6–15.2)

## 2011-07-29 MED ORDER — ATENOLOL 50 MG PO TABS: 100.0000 mg | ORAL_TABLET | Freq: Two times a day (BID) | ORAL | Status: DC

## 2011-07-29 MED ORDER — HYDROXYZINE HCL 25 MG PO TABS: 25.0000 mg | ORAL_TABLET | Freq: Four times a day (QID) | ORAL | Status: DC | PRN

## 2011-07-29 MED ORDER — CLONAZEPAM 0.5 MG PO TABS: 0.5000 mg | ORAL_TABLET | Freq: Every evening | ORAL | Status: DC | PRN

## 2011-07-29 NOTE — Assessment & Plan Note (Signed)
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 Patient is scheduled to have surgery this Saturday with Dr. Gerrit Friends Patient told that likely after surgery she needs to stop her atenolol Due to patient's anxiety we will give her some hydroxyzine but do think her anxiety is likely secondary to this thyroid nodule.

## 2011-07-29 NOTE — Progress Notes (Signed)
Quick Note:  These results are acceptable for scheduled surgery. TMG ______ 

## 2011-07-29 NOTE — Progress Notes (Signed)
Faxed to [facility] 

## 2011-07-29 NOTE — Patient Instructions (Signed)
Increase your atenolol to 100mg  twice daily I am giving you another medicine called hydroxyzine take 1 pill up to three times a day Stop the atenolol after surgery unless told otherwise by Dr. Gerrit Friends I want to see you again in 1 month we can see how your periods are at that time.

## 2011-07-29 NOTE — Progress Notes (Signed)
  Subjective:    Patient ID: Francis Dowse, female    DOB: March 28, 1976, 35 y.o.   MRN: [id number]  HPI 35 year old female with history of thyroid nodule now has been seen by surgery. Patient has a hyperplastic adenoma and is scheduled to have it removed this Saturday, November 3.She has had difficulty with tachycardia, inability to sleep, progressive enlargement of her thyroid nodules, mild dysphagia, and voice quality changes. Patient states she is still being very anxious feeling like she's having her heart beat out of her chest denies much chest pain or shortness of breath. Patient states that her anxiety seems to be much worse at this time. Patient does have some Clonopin she'll he takes at night and seems to be doing fairly well. Patient though would like to see if there is anything else she can take during the day to help her with this anxiety.  Patient denies any hair loss any excessive weight loss or any numbness or tingling in her extremities.  Review of Systems As stated in history of present illness    Objective:   Physical Exam General: NAD but anxious HEENT: pupils equal and reactive; sclerae clear; dentition good; mucous membranes moist NECK:  mass in the right thyroid. Palpation reveals multiple right thyroid nodules which are slightly tender. Isthmus and left lobe were without palpable abnormality; asymmetric on extension; no palpable anterior or posterior cervical lymphadenopathy; no supraclavicular masses; no tenderness CHEST: clear to auscultation bilaterally without rales, rhonchi, or wheezes CARDIAC: regular rate and rhythm without significant murmur; peripheral pulses are full EXT:  non-tender without edema; no deformity NEURO: no gross focal deficits; mild tremor    Assessment & Plan:

## 2011-07-29 NOTE — Assessment & Plan Note (Signed)
Patient had a doorknob comment about spotting throughout the month. Does not seem to be bothering her too much but told her I would like her to return in one month's time for a physical and she's due for a Pap smear anyhow. Patient has a family history that is significant for ovarian cancer so with keep a close eye on this.

## 2011-07-29 NOTE — Progress Notes (Signed)
Faxed to [facility] 

## 2011-07-30 NOTE — Progress Notes (Signed)
Quick Note:  These results are acceptable for scheduled surgery. TMG ______ 

## 2011-07-31 ENCOUNTER — Ambulatory Visit (HOSPITAL_COMMUNITY)
Admission: RE | Admit: 2011-07-31 | Discharge: 2011-08-01 | Disposition: A | Discharge status: Home / Self Care | Payer: Self-pay | Source: Ambulatory Visit | Attending: Surgery | Admitting: Surgery

## 2011-07-31 DIAGNOSIS — E063 Autoimmune thyroiditis: Secondary | ICD-10-CM

## 2011-07-31 DIAGNOSIS — Z01812 Encounter for preprocedural laboratory examination: Secondary | ICD-10-CM | POA: Insufficient documentation

## 2011-07-31 DIAGNOSIS — F17200 Nicotine dependence, unspecified, uncomplicated: Secondary | ICD-10-CM | POA: Insufficient documentation

## 2011-07-31 DIAGNOSIS — E041 Nontoxic single thyroid nodule: Secondary | ICD-10-CM | POA: Insufficient documentation

## 2011-07-31 DIAGNOSIS — E0500 Thyrotoxicosis with diffuse goiter without thyrotoxic crisis or storm: Secondary | ICD-10-CM | POA: Insufficient documentation

## 2011-07-31 DIAGNOSIS — Z01818 Encounter for other preprocedural examination: Secondary | ICD-10-CM | POA: Insufficient documentation

## 2011-07-31 DIAGNOSIS — I10 Essential (primary) hypertension: Secondary | ICD-10-CM | POA: Insufficient documentation

## 2011-07-31 DIAGNOSIS — Z01810 Encounter for preprocedural cardiovascular examination: Secondary | ICD-10-CM | POA: Insufficient documentation

## 2011-07-31 HISTORY — PX: TOTAL THYROIDECTOMY: SHX2547

## 2011-07-31 LAB — CALCIUM: Calcium: 8.8 mg/dL (ref 8.4–10.5)

## 2011-07-31 MED ORDER — DIPHENHYDRAMINE HCL 25 MG PO CAPS: 25.0000 mg | ORAL_CAPSULE | Freq: Three times a day (TID) | ORAL | Status: DC | PRN

## 2011-07-31 MED ORDER — MORPHINE SULFATE 2 MG/ML IJ SOLN
1.0000 mg | INTRAMUSCULAR | Status: DC | PRN
  Administered 2011-08-01: 2 mg via INTRAVENOUS

## 2011-07-31 MED ORDER — ATENOLOL 50 MG PO TABS
50.0000 mg | ORAL_TABLET | Freq: Every day | ORAL | Status: DC
  Filled 2011-07-31 (×2): qty 1

## 2011-07-31 MED ORDER — LACTATED RINGERS IV SOLN
INTRAVENOUS | Status: DC
  Administered 2011-08-01: 01:00:00 via INTRAVENOUS

## 2011-07-31 MED ORDER — NICOTINE 21 MG/24HR TD PT24
21.0000 mg | MEDICATED_PATCH | Freq: Every day | TRANSDERMAL | Status: DC
  Filled 2011-07-31 (×2): qty 1

## 2011-07-31 MED ORDER — HYDROCODONE-ACETAMINOPHEN 5-325 MG PO TABS
1.0000 | ORAL_TABLET | ORAL | Status: DC | PRN
  Administered 2011-08-01 (×2): 2 via ORAL

## 2011-07-31 MED ORDER — ACETAMINOPHEN 325 MG PO TABS: 650.0000 mg | ORAL_TABLET | ORAL | Status: DC | PRN

## 2011-07-31 MED ORDER — CALCIUM CARBONATE 1250 (500 CA) MG PO TABS
1.0000 | ORAL_TABLET | Freq: Three times a day (TID) | ORAL | Status: DC
  Filled 2011-07-31 (×5): qty 1

## 2011-07-31 MED ORDER — ONDANSETRON HCL 4 MG/2ML IJ SOLN
4.0000 mg | Freq: Four times a day (QID) | INTRAMUSCULAR | Status: DC | PRN
  Administered 2011-08-01: 4 mg via INTRAVENOUS

## 2011-08-01 ENCOUNTER — Other Ambulatory Visit (INDEPENDENT_AMBULATORY_CARE_PROVIDER_SITE_OTHER): Payer: Self-pay | Admitting: Surgery

## 2011-08-01 LAB — CALCIUM: Calcium: 8.6 mg/dL (ref 8.4–10.5)

## 2011-08-01 MED ORDER — HYDROCODONE-ACETAMINOPHEN 5-325 MG PO TABS
ORAL_TABLET | ORAL | Status: AC
  Administered 2011-08-01: 2 via ORAL
  Filled 2011-08-01: qty 2

## 2011-08-01 MED ORDER — DIAZEPAM 5 MG PO TABS
ORAL_TABLET | ORAL | Status: AC
  Filled 2011-08-01: qty 1

## 2011-08-01 MED ORDER — CALCIUM CARBONATE 1250 (500 CA) MG PO TABS: 2.0000 | ORAL_TABLET | Freq: Two times a day (BID) | ORAL | Status: DC

## 2011-08-01 MED ORDER — MORPHINE SULFATE 2 MG/ML IJ SOLN
INTRAMUSCULAR | Status: AC
  Administered 2011-08-01: 2 mg via INTRAVENOUS
  Filled 2011-08-01: qty 1

## 2011-08-01 MED ORDER — MORPHINE SULFATE 4 MG/ML IJ SOLN
INTRAMUSCULAR | Status: AC
  Administered 2011-08-01: 4 mg
  Filled 2011-08-01: qty 1

## 2011-08-01 MED ORDER — MORPHINE SULFATE 4 MG/ML IJ SOLN
INTRAMUSCULAR | Status: AC
  Administered 2011-08-01: 04:00:00
  Filled 2011-08-01: qty 1

## 2011-08-01 MED ORDER — DIAZEPAM 5 MG PO TABS: 5.0000 mg | ORAL_TABLET | Freq: Once | ORAL | Status: DC

## 2011-08-01 MED ORDER — ONDANSETRON HCL 4 MG/2ML IJ SOLN
INTRAMUSCULAR | Status: AC
  Administered 2011-08-01: 4 mg via INTRAVENOUS
  Filled 2011-08-01: qty 2

## 2011-08-01 MED ORDER — HYDROCODONE-ACETAMINOPHEN 5-325 MG PO TABS: 1.0000 | ORAL_TABLET | ORAL | Status: AC | PRN

## 2011-08-01 MED ORDER — ATENOLOL 50 MG PO TABS: 50.0000 mg | ORAL_TABLET | Freq: Every day | ORAL | Status: DC

## 2011-08-01 NOTE — Discharge Summary (Signed)
Patient given discharge instruction. Prescriptions given, iv catheter removed, catheter tip intact. New dressing applied to throat incision area. Patient verbalized understanding of discharge instructions. Left unit at 0845 with family member and accompanied by staff.

## 2011-08-01 NOTE — Discharge Summary (Signed)
Physician Discharge Summary  Patient ID: Katie Obrien MRN: [id number] DOB/AGE: [birth year] 35 y.o.  Admit date: 07/31/2011 Discharge date: 08/01/2011  Admission Diagnoses:  Discharge Diagnoses:  Active Problems:  THYROID NODULE, RIGHT  GRAVES' DISEASE   Discharged Condition: good  Hospital Course: Patient was admitted on the day of surgery. She was taken directly to the operating room where she underwent total thyroidectomy without complication. Postoperative course was straightforward. Serum calcium levels remained stable at 8.8 and 8.6.  Patient's pain was well controlled with oral narcotics.  Patient was prepared for discharge home on the first postoperative day.  Consults: none  Significant Diagnostic Studies: labs   Treatments: surgery: Total thyroidectomy  Discharge Exam: Blood pressure 142/90, pulse 90, temperature 98.1 F (36.7 C), temperature source Oral, resp. rate 20, height 5\' 11"  (1.803 m), weight 178 lb 9.2 oz (81 kg), SpO2 98.00%. Patient was seen and examined on the morning following surgery. Surgical wound was clear and dry. Minimal soft tissue swelling. Voice quality is normal.  Disposition: Home or Self Care  Discharge Orders    Future Appointments: Provider: Department: Dept Phone: Center:   08/25/2011 9:30 AM Velora Heckler, MD [facility] [phone] None     Future Orders Please Complete By Expires   Diet - low sodium heart healthy      Increase activity slowly      Discharge instructions      Comments:   Remove gauze on Monday. Leave Steristrips one week. May shower Monday. Take calcium tablets (2) twice a day. TMG   Remove dressing in 24 hours      Call MD for:  redness, tenderness, or signs of infection (pain, swelling, redness, odor or green/yellow discharge around incision site)        Current Discharge Medication List    START taking these medications   Details  calcium carbonate (OS-CAL - DOSED IN MG OF ELEMENTAL CALCIUM) 1250  MG tablet Take 2 tablets (1,000 mg of elemental calcium total) by mouth 2 (two) times daily. Qty: 60 tablet, Refills: 1    HYDROcodone-acetaminophen (NORCO) 5-325 MG per tablet Take 1-2 tablets by mouth every 4 (four) hours as needed for pain. Qty: 30 tablet, Refills: 0      CONTINUE these medications which have CHANGED   Details  atenolol (TENORMIN) 50 MG tablet Take 1 tablet (50 mg total) by mouth daily. Qty: 30 tablet, Refills: 0      CONTINUE these medications which have NOT CHANGED   Details  clonazePAM (KLONOPIN) 0.5 MG tablet Take 1 tablet (0.5 mg total) by mouth at bedtime as needed for anxiety. Qty: 60 tablet, Refills: 0    norgestimate-ethinyl estradiol (ORTHO-CYCLEN,SPRINTEC,PREVIFEM) 0.25-35 MG-MCG tablet Take 1 tablet by mouth daily. Pt took 3 tabs x 7 days       STOP taking these medications     HYDROcodone-acetaminophen (LORTAB) 7.5-500 MG/15ML solution      hydrOXYzine (VISTARIL) 25 MG capsule      ibuprofen (ADVIL,MOTRIN) 200 MG tablet      promethazine (PHENERGAN) 25 MG tablet      hydrOXYzine (ATARAX/VISTARIL) 25 MG tablet        Follow-up Information    Follow up with Velora Heckler, MD. Make an appointment in 2 weeks.   Contact information:   [employer] [address] [place] [address] [phone]          Signed: Velora Heckler 08/01/2011, 7:37 AM

## 2011-08-02 NOTE — Progress Notes (Signed)
Faxed to hospital.

## 2011-08-03 ENCOUNTER — Telehealth: Payer: Self-pay | Admitting: Family Medicine

## 2011-08-03 ENCOUNTER — Other Ambulatory Visit (INDEPENDENT_AMBULATORY_CARE_PROVIDER_SITE_OTHER): Payer: Self-pay | Admitting: Surgery

## 2011-08-03 ENCOUNTER — Telehealth (INDEPENDENT_AMBULATORY_CARE_PROVIDER_SITE_OTHER): Payer: Self-pay

## 2011-08-03 DIAGNOSIS — F411 Generalized anxiety disorder: Secondary | ICD-10-CM

## 2011-08-03 LAB — CALCIUM: Calcium: 8.2 mg/dL — ABNORMAL LOW (ref 8.4–10.5)

## 2011-08-03 MED ORDER — CLONAZEPAM 0.5 MG PO TABS: 0.5000 mg | ORAL_TABLET | Freq: Two times a day (BID) | ORAL | Status: DC | PRN

## 2011-08-03 NOTE — Telephone Encounter (Signed)
Ms. Katie Obrien is wanting to speak to Dr. Katrinka Blazing about the medications he prescribed.   She was told to stop taking everything, but now she needs new Rx's.  She said she needs some direction because she now has no medicine.

## 2011-08-03 NOTE — Telephone Encounter (Signed)
See above assessment and plan for anxiety

## 2011-08-03 NOTE — Assessment & Plan Note (Signed)
Called patient back discussed that we will fill her Klonopin at this point that she was taking in the hospital. Patient is also trying to stop her smoking which is causing increased anxiety. We will do one prescription at this time the patient to pick up up front told her that this will not be a common thing and did not would refill this medication.

## 2011-08-03 NOTE — Progress Notes (Signed)
Quick Note:  Please contact patient with benign path results. TMG ______ 

## 2011-08-03 NOTE — Telephone Encounter (Signed)
C/O tingling sensation on face. Also tingling and cramping in hands and legs. Faxed STAT serum calcium order at 9:18am to [facility]. Per Dr. Gerrit Friends take two Ca tablets four times daily. Get Rx for Rocaltrol .25 mcg #30- take 1 po BID- Rx called to [facility] [phone]. Taken by Barbara Cower the Pharmacist. Patient aware.

## 2011-08-04 ENCOUNTER — Telehealth (INDEPENDENT_AMBULATORY_CARE_PROVIDER_SITE_OTHER): Payer: Self-pay

## 2011-08-04 IMAGING — CT CT ABD-PELV W/ CM
2 of 4 series · 17 of 46 positions shown, 19 images · IV contrast (agent unspecified)
Comparison: 04/01/2010

CLINICAL DATA: Pyelonephritis.  Fever.  Left flank pain.

CT ABDOMEN AND PELVIS WITH CONTRAST
TECHNIQUE: Multidetector CT imaging of the abdomen and pelvis was
performed following the standard protocol during bolus
administration of intravenous contrast.
Contrast: 80 ml of Mmnipaque-NMM

[Series 2: routine abdomen · axial · 0.90mm/px · z∈[-526,-100]mm · 14 of 93 slices shown, 16 images]
[im 4/93  soft-tissue]
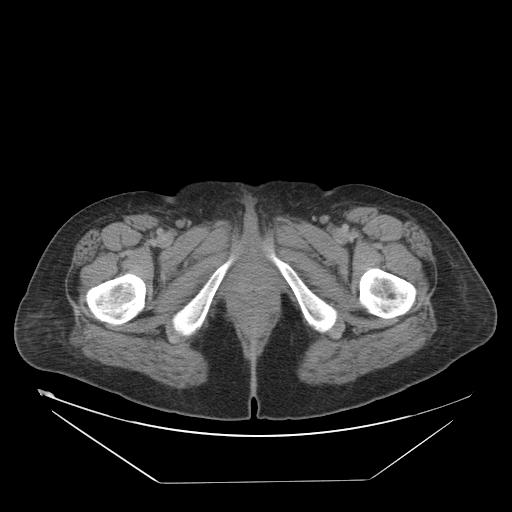
[im 4/93  bone]
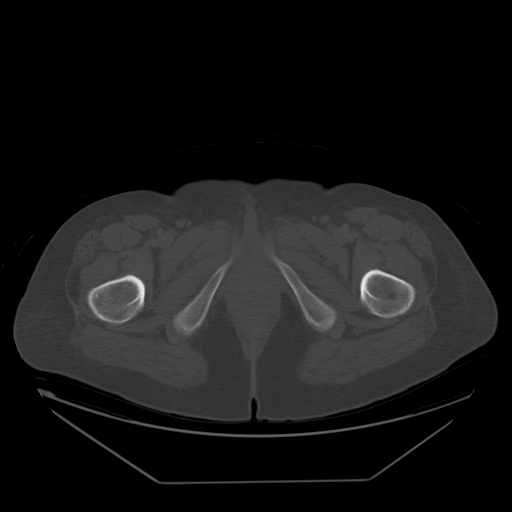
[im 12/93  soft-tissue]
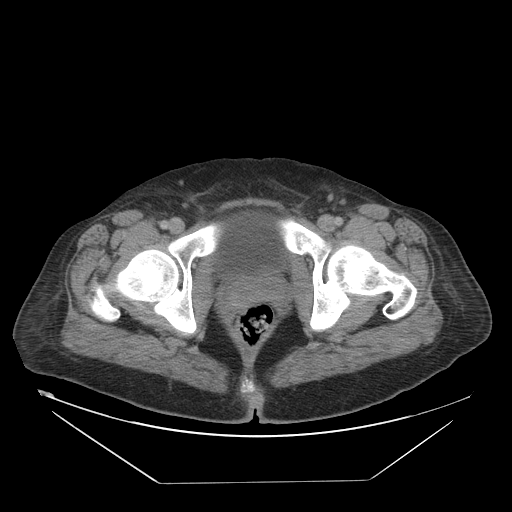
[im 19/93  soft-tissue]
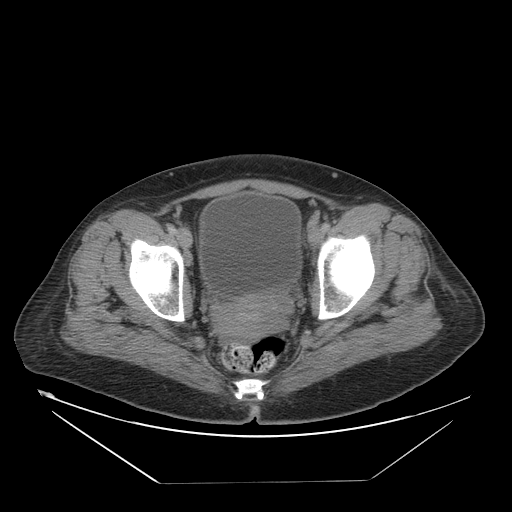
[im 26/93  soft-tissue]
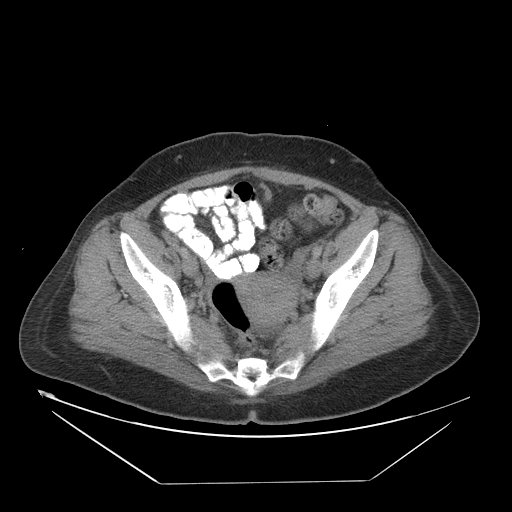
[im 30/93  soft-tissue]
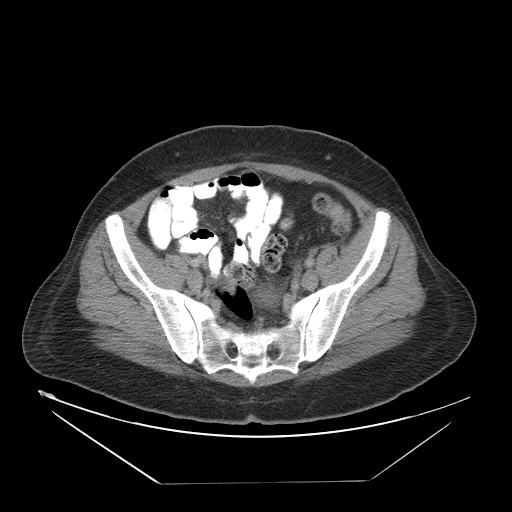
[im 37/93  soft-tissue]
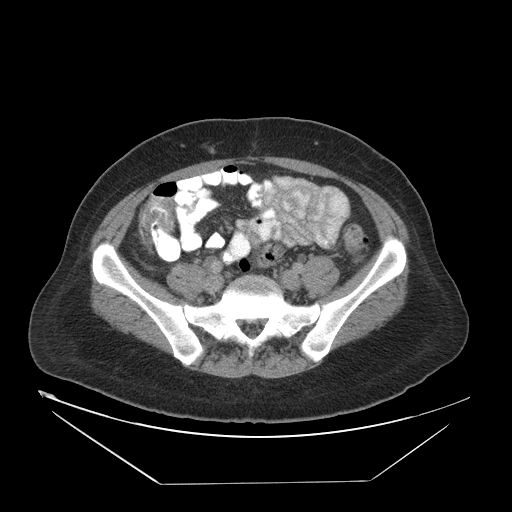
[im 45/93  soft-tissue]
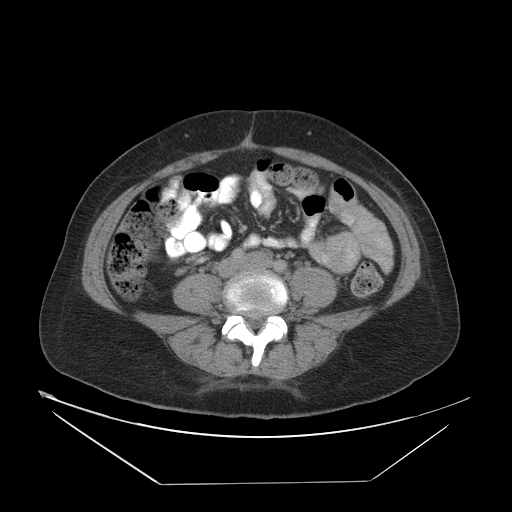
[im 48/93  soft-tissue]
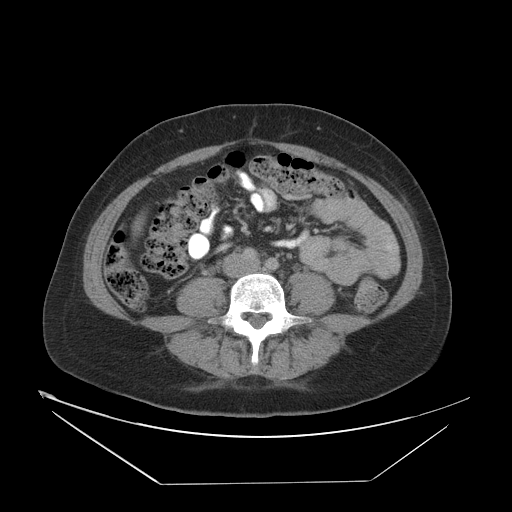
[im 56/93  soft-tissue]
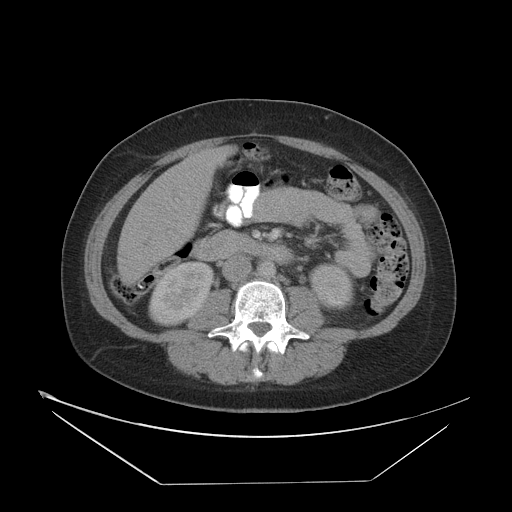
[im 56/93  bone]
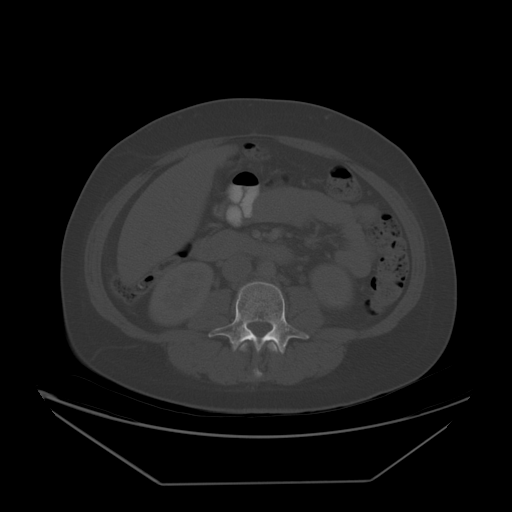
[im 63/93  soft-tissue]
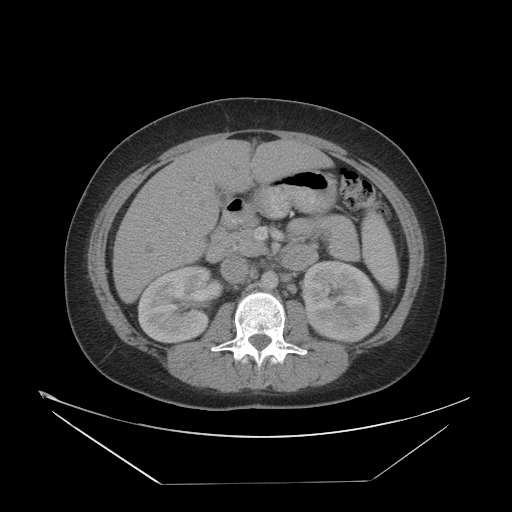
[im 70/93  soft-tissue]
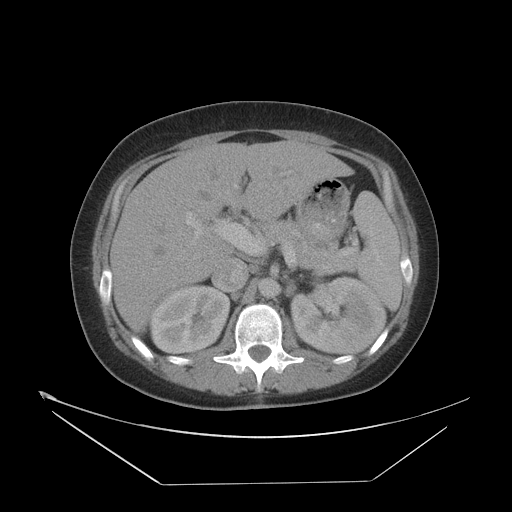
[im 74/93  soft-tissue]
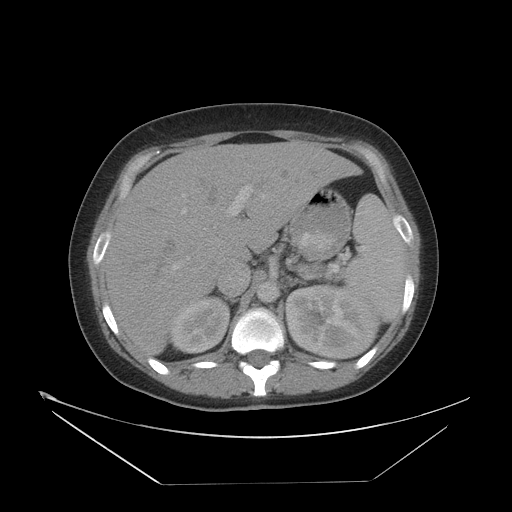
[im 81/93  soft-tissue]
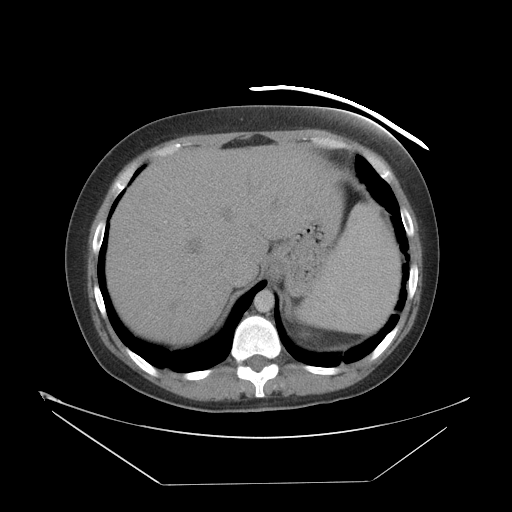
[im 89/93  soft-tissue]
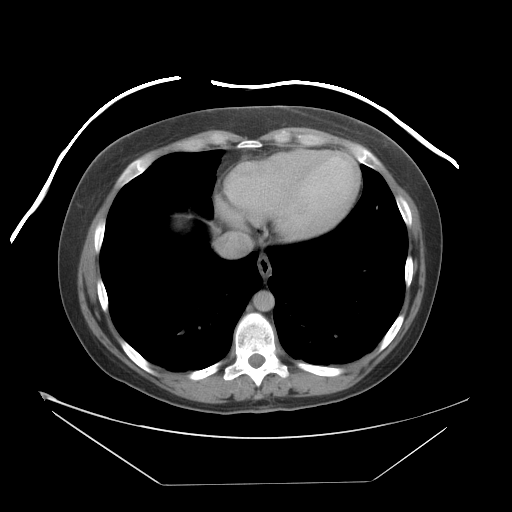

[Series 401: reformatted · coronal · 0.92mm/px · 3 of 97 slices shown]
[im 33/97  soft-tissue]
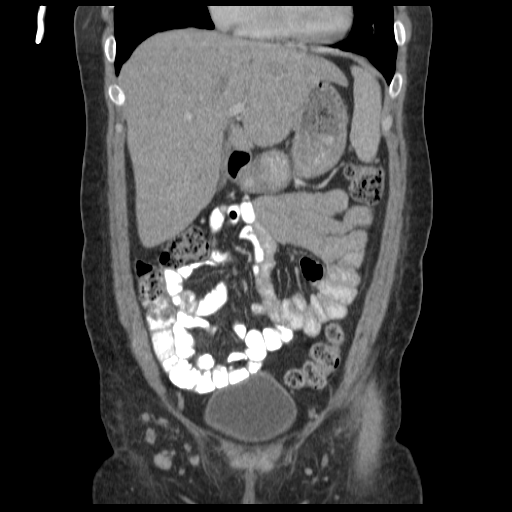
[im 43/97  soft-tissue]
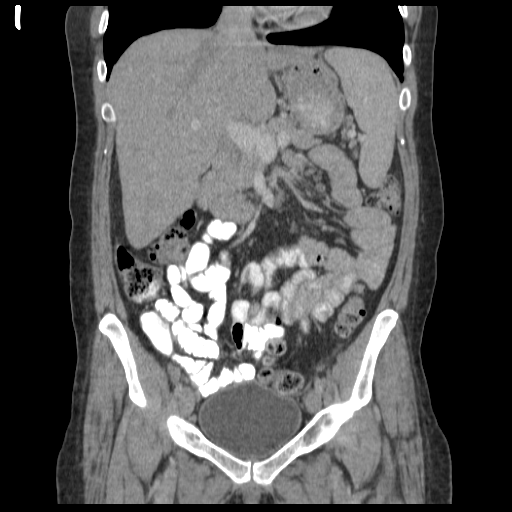
[im 54/97  soft-tissue]
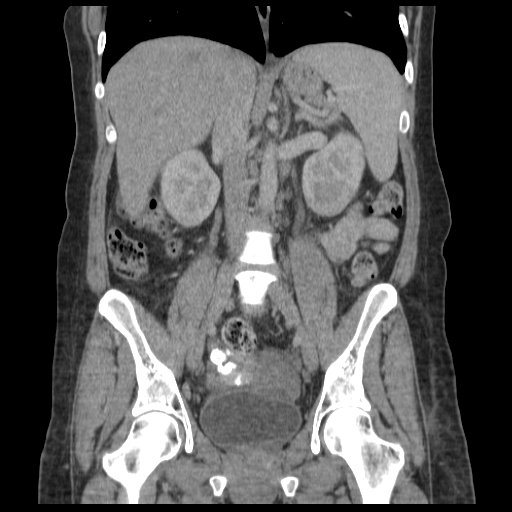

[17 of 46 positions shown; findings below may reference images not displayed]

FINDINGS: The soft tissue stranding around the left kidney seen on
the prior exam has resolved.  The small amount of fluid in the left
perinephric space has resolved.  The free fluid in the pelvis has
diminished.

There is only slight decreased enhancement of the periphery of the
upper pole and lateral aspect of the midportion of the left kidney
suggesting subtle changes of pyelonephritis.

The liver, spleen, pancreas, adrenal glands, and right kidney are
normal.

The bowel appears normal.  The tiny left effusion has resolved.
IMPRESSION: Almost complete resolution of the inflammatory changes around the
left kidney.  This is consistent with resolving pyelonephritis.

Decreased fluid in the pelvis.  No new abnormalities.

## 2011-08-04 NOTE — Progress Notes (Signed)
Patient aware of results.

## 2011-08-04 NOTE — Telephone Encounter (Signed)
Per Dr. Gerrit Friends take Vicodin with food- if still can not tolerate may take Advil or Aleve. Patient aware.

## 2011-08-05 NOTE — Op Note (Signed)
Katie Obrien, Katie Obrien             ACCOUNT NO.:  [id number]  MEDICAL RECORD NO.:  [id number]  LOCATION:  1534                         FACILITY:  [facility]  PHYSICIAN:  Velora Heckler, MD      DATE OF BIRTH:  10/15/1975  DATE OF PROCEDURE:  07/31/2011                               OPERATIVE REPORT   PREOPERATIVE DIAGNOSES:  Thyroid nodules, hyperthyroidism.  POSTOPERATIVE DIAGNOSES:  Thyroid nodules, hyperthyroidism.  PROCEDURE:  Total thyroidectomy.  SURGEON:  Darnell Level MD, FACS  ANESTHESIA:  General per Dr. Ronelle Nigh.  ESTIMATED BLOOD LOSS:  Minimal.  PREPARATION:  ChloraPrep.  COMPLICATIONS:  None.  INDICATIONS:  The patient is a 35 year old white female, referred by her primary care physician, with thyroid nodules and hyperthyroidism.  The patient had been diagnosed approximately 1 year ago.  She had noted progressive enlargement in the nodules.  She has significant hyperthyroidism and is on beta blockade, but could not afford the antithyroid medication and has not been taking Tapazole.  She now comes to surgery for total thyroidectomy for management of thyroid nodules and hyperthyroidism.  OPERATIVE REPORT:  Procedure was done in OR #11 at the [facility].  The patient was brought to the operating room, placed in supine position on the operating room table.  Following administration of general anesthesia, the patient was positioned and then prepped and draped in the usual strict aseptic fashion.  After ascertaining that an adequate level of anesthesia had been achieved, a Kocher incision was made with a #15 blade.  Dissection was carried through subcutaneous tissues and platysma.  Hemostasis was obtained with electrocautery.  Skin flaps were elevated cephalad and caudad from the thyroid notch to the sternal notch.  A Mahorner self-retaining retractors placed for exposure.  Strap muscles were incised in the midline.  Dissection was begun on the  right.  Strap muscles were reflected laterally.  There does appear to be significant inflammatory change as the strap muscles are adherent to the capsule of the thyroid. With some difficulty, the thyroid lobe was gently dissected out. Superior pole vessels were dissected out and divided between medium Ligaclips with the Harmonic scalpel.  Superior parathyroid gland was identified and preserved.  Gland was rolled anteriorly.  The recurrent laryngeal nerve was in a dangerous position traversing across the surface of the thyroid capsule.  There was a prominent tubercle of Zuckerkandl.  The nerve was gently mobilized taking care to avoid injury.  Branches of the inferior thyroid artery were divided between small Ligaclips with the Harmonic scalpel.  Inferior venous tributaries were divided between medium Ligaclips with the Harmonic scalpel.  Gland was rolled further anteriorly.  The nerve was mobilized off the surface of the thyroid and back into the tracheoesophageal groove.  Ligament of Allyson Sabal was released with the electrocautery and the gland was mobilized onto the anterior trachea.  Remaining venous tributaries were divided between Ligaclips and the gland was mobilized up and onto the anterior trachea and across the midline.  Right lobe was markedly enlarged. There does not appear to be any significant pyramidal lobe.  Next, we turned our attention to the left thyroid lobe.  Again, strap muscles were reflected laterally  with some difficulty as there are inflammatory changes present.  Superior pole vessels were dissected out and divided between medium Ligaclips with the Harmonic scalpel.  Again, the superior parathyroid gland was identified and preserved.  Branches of the inferior thyroid artery were divided between small Ligaclips with the Harmonic scalpel.  Recurrent laryngeal nerve was also identified and preserved.  Inferior venous tributaries were divided between medium Ligaclips with  the Harmonic scalpel.  Ligament of Allyson Sabal was released with the electrocautery and the gland was mobilized onto the anterior trachea from which it was completely excised.  Sutures used to mark the right superior pole.  The entire thyroid gland was submitted to Pathology for review.  Neck was irrigated with warm saline and good hemostasis was achieved bilaterally.  Surgicel was placed throughout the operative field.  Strap muscles were reapproximated in the midline with interrupted 3-0 Vicryl sutures.  Platysma was closed with interrupted 3-0 Vicryl sutures.  Skin was closed with a running 4-0 Monocryl subcuticular suture.  Wound was washed and dried and benzoin and Steri-Strips were applied.  Sterile dressings were applied.  The patient was awakened from anesthesia and brought to the recovery room.  The patient tolerated the procedure well.   Velora Heckler, MD, FACS     TMG/MEDQ  D:  07/31/2011  T:  07/31/2011  Job:  [id number]  cc:   Antoine Primas, DO  Electronically Signed by Darnell Level MD on 08/05/2011 10:34:36 AM

## 2011-08-09 ENCOUNTER — Ambulatory Visit (INDEPENDENT_AMBULATORY_CARE_PROVIDER_SITE_OTHER): Payer: Self-pay | Admitting: *Deleted

## 2011-08-09 DIAGNOSIS — Z23 Encounter for immunization: Secondary | ICD-10-CM

## 2011-08-10 ENCOUNTER — Telehealth (INDEPENDENT_AMBULATORY_CARE_PROVIDER_SITE_OTHER): Payer: Self-pay

## 2011-08-10 NOTE — Telephone Encounter (Signed)
Percocet 5/325 #20 written by Dr. Gerrit Friends. Patient aware will pick up at front desk.

## 2011-08-24 ENCOUNTER — Encounter: Payer: Self-pay | Admitting: Family Medicine

## 2011-08-24 ENCOUNTER — Telehealth: Payer: Self-pay | Admitting: Family Medicine

## 2011-08-24 ENCOUNTER — Ambulatory Visit (INDEPENDENT_AMBULATORY_CARE_PROVIDER_SITE_OTHER): Payer: Self-pay | Admitting: Family Medicine

## 2011-08-24 VITALS — BP 118/77 | HR 70 | Temp 98.7°F | Ht 71.0 in | Wt 195.5 lb

## 2011-08-24 DIAGNOSIS — G43909 Migraine, unspecified, not intractable, without status migrainosus: Secondary | ICD-10-CM

## 2011-08-24 DIAGNOSIS — F17200 Nicotine dependence, unspecified, uncomplicated: Secondary | ICD-10-CM

## 2011-08-24 DIAGNOSIS — E041 Nontoxic single thyroid nodule: Secondary | ICD-10-CM

## 2011-08-24 MED ORDER — OMEPRAZOLE 20 MG PO CPDR: 20.0000 mg | DELAYED_RELEASE_CAPSULE | Freq: Every day | ORAL | Status: DC

## 2011-08-24 MED ORDER — SUMATRIPTAN SUCCINATE 50 MG PO TABS: 50.0000 mg | ORAL_TABLET | ORAL | Status: DC | PRN

## 2011-08-24 MED ORDER — SUMATRIPTAN SUCCINATE 6 MG/0.5ML ~~LOC~~ SOLN
4.0000 mg | Freq: Once | SUBCUTANEOUS | Status: AC
  Administered 2011-08-24: 3.96 mg via SUBCUTANEOUS

## 2011-08-24 MED ORDER — HYDROCODONE-ACETAMINOPHEN 5-325 MG PO TABS: 1.0000 | ORAL_TABLET | Freq: Three times a day (TID) | ORAL | Status: AC | PRN

## 2011-08-24 MED ORDER — KETOROLAC TROMETHAMINE 30 MG/ML IJ SOLN
30.0000 mg | Freq: Once | INTRAMUSCULAR | Status: AC
  Administered 2011-08-24: 30 mg via INTRAMUSCULAR

## 2011-08-24 NOTE — Patient Instructions (Addendum)
I'm giving you to injections today to help you migraines. I when she to continue atenolol 50 mg daily. I am going to give you another medication you can use when you feel a migraine coming on try not to use this as much as possible. Keep your followup appointment with your surgeon. I'm giving you a medication called omeprazole to help with your gastric reflux disease. Congratulations on stopping smoking!

## 2011-08-24 NOTE — Telephone Encounter (Signed)
Attempted to call no answer unable to fill medication without seeing patient she would need to be seen for me to give her any other type of medication. We'll try and collect later

## 2011-08-24 NOTE — Telephone Encounter (Signed)
Ms. Katie Obrien has had a migraine for 4 days and her normal meds are not working.  She is hoping that something different can be called in for her because she doesn't want to have to be seen.

## 2011-08-24 NOTE — Progress Notes (Signed)
  Subjective:    Katie Obrien is a 35 y.o. female who presents for evaluation of headache. Symptoms began about 4 days ago. Generally, the headaches last about 1 day and occur every day. The headaches do not seem to be related to any time of the day. The headaches are usually moderate and dull and are located in frontal area.  The patient rates her most severe headaches a 10 on a scale from 1 to 10. Recently, the headaches have been stable. Work attendance or other daily activities are not affected by the headaches. Precipitating factors include: none which have been determined. Patient though had held her atenolol secondary to being on it for her thyroid nodule which she had removed recently The headaches are usually not preceded by an aura. Associated neurologic symptoms: decreased physical activity and worsening school/work performance. The patient denies dizziness, muscle weakness, numbness of extremities and speech difficulties. Home treatment has included acetaminophen, darkening the room, resting and sleeping with little improvement. Other history includes: migraine headaches diagnosed in the past and tension headaches diagnosed in the past. Family history includes migraine headaches in patient, mother and father.  The following portions of the patient's history were reviewed and updated as appropriate: allergies, current medications, past family history, past medical history, past social history, past surgical history and problem list.  Review of Systems Pertinent items are noted in HPI.    Objective:    LMP 07/29/2011 General appearance: alert and cooperative Head: Normocephalic, without obvious abnormality, atraumatic Eyes: conjunctivae/corneas clear. PERRL, EOM's intact. Fundi benign. Ears: normal TM's and external ear canals both ears Throat: lips, mucosa, and tongue normal; teeth and gums normal Neck: no adenopathy, no carotid bruit, supple, symmetrical, trachea midline and thyroid  not enlarged, symmetric, no tenderness/mass/nodules well healed incision from thyroid surgery. Lungs: clear to auscultation bilaterally Heart: regular rate and rhythm, S1, S2 normal, no murmur, click, rub or gallop Abdomen: soft, non-tender; bowel sounds normal; no masses,  no organomegaly Extremities: extremities normal, atraumatic, no cyanosis or edema Pulses: 2+ and symmetric   Neurologic: Cranial nerves II through XII intact neurovascularly intact in all extremities 5 out of 5 strength in all extremities Assessment:    Classic migraine Potential withdrawal from beta blocker    Plan:    Continue present treatment and plan. Lie in darkened room and apply cold packs as needed for pain. Prophylactic therapy: prophylaxis with beta blockers (no history of asthma) due to high frequency of pain. Side effect profile discussed in detail. Asked to keep headache diary. Patient reassured that neurodiagnostic workup not indicated from benign H&P.  Patient given Imitrex today with a Toradol shot

## 2011-08-24 NOTE — Progress Notes (Signed)
Addended by: Jone Baseman D on: 08/24/2011 04:57 PM   Modules accepted: Orders

## 2011-08-25 ENCOUNTER — Ambulatory Visit (INDEPENDENT_AMBULATORY_CARE_PROVIDER_SITE_OTHER): Payer: PRIVATE HEALTH INSURANCE | Admitting: Surgery

## 2011-08-25 ENCOUNTER — Encounter (INDEPENDENT_AMBULATORY_CARE_PROVIDER_SITE_OTHER): Payer: Self-pay | Admitting: Surgery

## 2011-08-25 DIAGNOSIS — E041 Nontoxic single thyroid nodule: Secondary | ICD-10-CM

## 2011-08-25 DIAGNOSIS — E0500 Thyrotoxicosis with diffuse goiter without thyrotoxic crisis or storm: Secondary | ICD-10-CM

## 2011-08-25 MED ORDER — SYNTHROID 100 MCG PO TABS: 100.0000 ug | ORAL_TABLET | Freq: Every day | ORAL | Status: DC

## 2011-08-25 NOTE — Patient Instructions (Signed)
  COCOA BUTTER & VITAMIN E CREAM  (Palmer's or other brand)  Apply cocoa butter/vitamin E cream to your incision 2 - 3 times daily.  Massage cream into incision for one minute with each application.  Use sunscreen (50 SPF or higher) for first 6 months after surgery.  You may substitute Mederma or other scar reducing creams as desired.   

## 2011-08-25 NOTE — Progress Notes (Signed)
Visit Diagnoses: 1. GRAVES' DISEASE   2. THYROID NODULE, RIGHT     HISTORY: Patient returns for her first postoperative visit having undergone total thyroidectomy for hyperthyroidism and thyroid nodules. The final pathologic results were benign. Postoperative calcium levels were within the normal range. Patient is currently not taking thyroid hormone.  EXAM: Surgical wound is well healed. Minimal soft tissue swelling. For his quality is normal.   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 We will check a TSH level in 6 weeks. At this time she may discontinue calcium supplementation. She will begin applying topical creams to her incision. She will return to see me for a final wound check in 6 weeks.   Velora Heckler, MD, FACS General & Endocrine Surgery [facility]

## 2011-08-26 ENCOUNTER — Telehealth (INDEPENDENT_AMBULATORY_CARE_PROVIDER_SITE_OTHER): Payer: Self-pay

## 2011-08-26 NOTE — Telephone Encounter (Signed)
C/O cost of brand name Synthroid. Generic ok per Dr. Gerrit Friends. Rx called to [facility] [address]

## 2011-09-03 ENCOUNTER — Ambulatory Visit: Admit: 2011-09-03 | Payer: Self-pay | Admitting: Surgery

## 2011-09-03 SURGERY — THYROIDECTOMY
Anesthesia: General

## 2011-09-07 ENCOUNTER — Ambulatory Visit (INDEPENDENT_AMBULATORY_CARE_PROVIDER_SITE_OTHER): Payer: Self-pay | Admitting: Family Medicine

## 2011-09-07 ENCOUNTER — Encounter: Payer: Self-pay | Admitting: Family Medicine

## 2011-09-07 ENCOUNTER — Other Ambulatory Visit (HOSPITAL_COMMUNITY)
Admission: RE | Admit: 2011-09-07 | Discharge: 2011-09-07 | Disposition: A | Discharge status: Home / Self Care | Payer: Self-pay | Source: Ambulatory Visit | Attending: Family Medicine | Admitting: Family Medicine

## 2011-09-07 VITALS — BP 132/84 | HR 86 | Temp 98.6°F | Ht 71.0 in | Wt 201.0 lb

## 2011-09-07 DIAGNOSIS — Z124 Encounter for screening for malignant neoplasm of cervix: Secondary | ICD-10-CM

## 2011-09-07 DIAGNOSIS — Z1159 Encounter for screening for other viral diseases: Secondary | ICD-10-CM | POA: Insufficient documentation

## 2011-09-07 DIAGNOSIS — Z01419 Encounter for gynecological examination (general) (routine) without abnormal findings: Secondary | ICD-10-CM | POA: Insufficient documentation

## 2011-09-07 DIAGNOSIS — N760 Acute vaginitis: Secondary | ICD-10-CM

## 2011-09-07 DIAGNOSIS — L723 Sebaceous cyst: Secondary | ICD-10-CM

## 2011-09-07 DIAGNOSIS — N920 Excessive and frequent menstruation with regular cycle: Secondary | ICD-10-CM

## 2011-09-07 DIAGNOSIS — F411 Generalized anxiety disorder: Secondary | ICD-10-CM

## 2011-09-07 DIAGNOSIS — IMO0002 Reserved for concepts with insufficient information to code with codable children: Secondary | ICD-10-CM

## 2011-09-07 LAB — POCT WET PREP (WET MOUNT): Trichomonas Wet Prep HPF POC: NEGATIVE

## 2011-09-07 MED ORDER — CITALOPRAM HYDROBROMIDE 20 MG PO TABS: 20.0000 mg | ORAL_TABLET | Freq: Every day | ORAL | Status: DC

## 2011-09-07 MED ORDER — CLONAZEPAM 0.5 MG PO TABS: 0.5000 mg | ORAL_TABLET | Freq: Two times a day (BID) | ORAL | Status: DC | PRN

## 2011-09-07 NOTE — Patient Instructions (Addendum)
Today we are checking your Pap smear as well some other tests We will send you for an ultrasound to evaluate for fibroids. Please make an appointment to see Dr. Katrinka Blazing next week to talk about your anxiety. I am giving you enough Klonopin to last about time.  Please start Celexa today. This is on the $4 list  Please go to our front desk and make an appointment for sports medicine. I would like them to do a musculoskeletal ultrasound of that spot on her leg. I will send them in note from your visit today.

## 2011-09-07 NOTE — Assessment & Plan Note (Signed)
Menorrhagia likely due to thyroid dysfunction. Patient is very concerned about fibroids. Will send for ultrasound today. Followup with Dr. Katrinka Blazing

## 2011-09-07 NOTE — Progress Notes (Signed)
  Subjective:    Patient ID: Katie Obrien, female    DOB: 04-21-1976, 35 y.o.   MRN: [id number]  HPI  Patient presents today for well woman exam. Dysfunction uterine bleeding-patient has had uterine bleeding for the last 5-6 months intermittently, more on than off. She had her thyroid out 5 weeks ago and she's had her period until 2 days ago. She is concerned that something more is going on besides the thyroid imbalance. She is concerned that she has fibroids. She says that her periods were irregular and long before her thyroid became and bounced. She denies any discharge, odor, itch. She would like to check for STDs today. Anxiety-patient has been coming increasingly anxious. She has been using Klonopin on and off for this. This helps her some but her sister states that she's been having more and more irritable and fasting at everyone. She had a depressive episode several years ago but has not had one since then. She did well on Wellbutrin in the past and needs a $75medication. Cyst on leg-patient has had a bump on her leg on the left lateral side for several years. Over the last week or so it started hurting and causing some numbness around that area. She has had leg tingling since her thyroid was felt that this is different.  Review of Systems Denies CP, SOB, HA, N/V/D, fever     Objective:   Physical Exam Vital signs reviewed General appearance - alert, well appearing, and in no distress and oriented to person, place, and time Heart - normal rate, regular rhythm, normal S1, S2, no murmurs, rubs, clicks or gallops Chest - clear to auscultation, no wheezes, rales or rhonchi, symmetric air entry, no tachypnea, retractions or cyanosis GYN- external genetalia normal, without lesions.  Vagina normal color, rugations, without discharge.  Cervix normal color without lesions or discharge. Uterus is a normal size. There is mild tenderness over both adnexa.        Assessment & Plan:

## 2011-09-07 NOTE — Assessment & Plan Note (Signed)
Pap, GC, wet prep today. Patient to followup with Dr. Katrinka Blazing of the results next week.

## 2011-09-07 NOTE — Assessment & Plan Note (Signed)
Patient with fluid-filled cysts on left leg. Will send to sports medicine for an MSK ultrasound for evaluation. I am not sure if this is a fluid-filled cyst or possibly a varicose vein. I last sports medicine for further evaluation and thoughts on treatment.

## 2011-09-07 NOTE — Assessment & Plan Note (Signed)
I will start Celexa today. I will give her enough Klonopin to make a turn With Dr. Katrinka Blazing. She will continue this discussion with Dr. Katrinka Blazing.

## 2011-09-08 LAB — GC/CHLAMYDIA PROBE AMP, GENITAL: Chlamydia, DNA Probe: NEGATIVE

## 2011-09-09 ENCOUNTER — Ambulatory Visit (INDEPENDENT_AMBULATORY_CARE_PROVIDER_SITE_OTHER): Payer: Self-pay | Admitting: Family Medicine

## 2011-09-09 ENCOUNTER — Encounter: Payer: Self-pay | Admitting: Family Medicine

## 2011-09-09 VITALS — BP 112/76 | HR 96 | Ht 71.0 in | Wt 197.0 lb

## 2011-09-09 DIAGNOSIS — IMO0002 Reserved for concepts with insufficient information to code with codable children: Secondary | ICD-10-CM

## 2011-09-09 DIAGNOSIS — L723 Sebaceous cyst: Secondary | ICD-10-CM

## 2011-09-12 NOTE — Progress Notes (Signed)
  Patient Name: Katie Obrien Date of Birth: 02-28-1976 Age: 35 y.o. Medical Record Number: [id number] Gender: female  History of Present Illness:  Katie Obrien is a 35 y.o. very pleasant female patient who presents with the following:  Very pleasant patient seen for evaluation of a mobile cyst-like lesion on the L lower lateral leg. This has been present for many months, even years and has caused the patient no problems.   Over the last month or so, she had a recent thyroidectomy for Graves disease, and associates this with some distal LE tingling distal to the cyst. She also notes some recent increase in LE edema.  Past Medical History, Surgical History, Social History, Family History, and Problem List have been reviewed in EHR and updated if relevant.  Review of Systems:  GEN: No fevers, chills. Nontoxic. Primarily MSK c/o today. MSK: Detailed in the HPI GI: tolerating PO intake without difficulty Neuro: detailed above Otherwise the pertinent positives of the ROS are noted above.    Physical Examination: Filed Vitals:   09/09/11 1351  BP: 112/76  Pulse: 96  Height: 5\' 11"  (1.803 m)  Weight: 197 lb (89.359 kg)    Body mass index is 27.48 kg/(m^2).   GEN: WDWN, NAD, Non-toxic, Alert & Oriented x 3 HEENT: Atraumatic, Normocephalic.  Ears and Nose: No external deformity. EXTR: No clubbing/cyanosis/edema NEURO: Normal gait.  PSYCH: Normally interactive. Conversant. Not depressed or anxious appearing.  Calm demeanor.  Moves all ext normally, full ROM, str 5/5 LE. Sensation preserved. Palpable freely mobile, soft area, lateral LE, approx 1.5 cm across  Diagnostic Ultrasound Evaluation [employer] E, MSK ultrasound, MSK probe Anatomy scanned: L lateral leg lesion Indication: tingling Findings: Circumscribed around well-visualized area that corresponds to clinical area of question that does not show uniform hypoechoic texture. Suspect that this would  correspond to thickened more dense contained material. There is no doppler flow to this area. It is not vascular.   Assessment and Plan: 1. Cyst     Circumscribed, contained area that is not vascular in origin. Most likely a cyst-like structure containing material that is more dense or possibly fibrotic given [place] appearance. Suspect right now causing irritation to superficial peroneal nerve - could be from edema, could be entrapped.  I discussed with the patient, and I would personally be conservative with this and follow it.   Injection, cyst-like structure The patient was verbally and written consented. Risks, benefits, and alternatives were discussed including lightening of the skin and potential infection. The patient was prepped with betadine. A mixture of 1/2 cc of lidocaine 1% and 1/2 cc of Depo-Medrol 40 mg was injected into the cyst directly. I attempted to aspirate with an 18 gauge needle but could get no fluid. No difficulty. No complications.  Hopefully, this will shrink this area to some degree.

## 2011-09-13 ENCOUNTER — Ambulatory Visit: Payer: Self-pay | Admitting: Family Medicine

## 2011-09-15 ENCOUNTER — Ambulatory Visit (HOSPITAL_COMMUNITY): Payer: 59 | Attending: Family Medicine

## 2011-09-23 ENCOUNTER — Telehealth: Payer: Self-pay | Admitting: Family Medicine

## 2011-09-23 NOTE — Telephone Encounter (Signed)
Calling for lab results. °

## 2011-09-23 NOTE — Telephone Encounter (Signed)
Called patient back told her that her Pap smear was abnormal with ASCUS. Told her that next step would be to it do a colposcopy. Patient will look at her schedule and call back to make appointment in women's clinic.

## 2011-09-23 NOTE — Telephone Encounter (Signed)
Patient informed of negative gc and chlamydia. Informed her that MD would call with pap results, as they were abnormal.

## 2011-10-13 ENCOUNTER — Encounter (INDEPENDENT_AMBULATORY_CARE_PROVIDER_SITE_OTHER): Payer: PRIVATE HEALTH INSURANCE | Admitting: Surgery

## 2011-11-10 ENCOUNTER — Encounter (INDEPENDENT_AMBULATORY_CARE_PROVIDER_SITE_OTHER): Payer: Self-pay | Admitting: Surgery

## 2011-11-10 ENCOUNTER — Other Ambulatory Visit (INDEPENDENT_AMBULATORY_CARE_PROVIDER_SITE_OTHER): Payer: Self-pay | Admitting: Surgery

## 2011-11-10 ENCOUNTER — Ambulatory Visit (INDEPENDENT_AMBULATORY_CARE_PROVIDER_SITE_OTHER): Payer: Self-pay | Admitting: Surgery

## 2011-11-10 DIAGNOSIS — E0500 Thyrotoxicosis with diffuse goiter without thyrotoxic crisis or storm: Secondary | ICD-10-CM

## 2011-11-10 DIAGNOSIS — E041 Nontoxic single thyroid nodule: Secondary | ICD-10-CM

## 2011-11-10 LAB — CALCIUM: Calcium: 8.6 mg/dL (ref 8.4–10.5)

## 2011-11-10 NOTE — Progress Notes (Signed)
Visit Diagnoses: 1. GRAVES' DISEASE   2. THYROID NODULE, RIGHT     HISTORY: The patient returns for followup having undergone total thyroidectomy for management of Graves' disease. Postoperatively she has done well. She was taking Synthroid 100 mcg daily. However, because she felt fatigued, the patient has begun taking 150 mcg daily of her own accord. She has been doing this for approximately 10 days. Patient has not had a TSH level. We will ask her today to go to the laboratory and check a TSH level and a serum calcium level.  EXAM: Surgical wound is well-healed. No palpable masses. No sign of seroma. No sign of infection. His quality is normal.    IMPRESSION: Status post total thyroidectomy for management of Graves' disease  PLAN: The patient will have a TSH level drawn today. I will adjust her thyroid hormone replacement dosage based on this level as well as I can given the fact that she has been taking an inconsistent dosage of her medication. I will forward the results to her primary care physician. He will assume the management of her thyroid hormone replacement and monitoring her TSH level from this point forward.  Patient will return to this office as needed.  Velora Heckler, MD, FACS General & Endocrine Surgery [facility]

## 2011-11-10 NOTE — Patient Instructions (Signed)
  COCOA BUTTER & VITAMIN E CREAM  (Palmer's or other brand)  Apply cocoa butter/vitamin E cream to your incision 2 - 3 times daily.  Massage cream into incision for one minute with each application.  Use sunscreen (50 SPF or higher) for first 6 months after surgery if area is exposed to sun.  You may substitute Mederma or other scar reducing creams as desired.   

## 2011-11-11 LAB — TSH: TSH: 29.56 u[IU]/mL — ABNORMAL HIGH (ref 0.350–4.500)

## 2011-11-16 ENCOUNTER — Telehealth (INDEPENDENT_AMBULATORY_CARE_PROVIDER_SITE_OTHER): Payer: Self-pay

## 2011-11-16 NOTE — Telephone Encounter (Signed)
Calcium and TSH levels faxed to Dr. Katrinka Blazing 11/16/2011.

## 2011-11-17 ENCOUNTER — Telehealth (INDEPENDENT_AMBULATORY_CARE_PROVIDER_SITE_OTHER): Payer: Self-pay | Admitting: Surgery

## 2011-11-17 MED ORDER — LEVOTHYROXINE SODIUM 175 MCG PO TABS: 175.0000 ug | ORAL_TABLET | Freq: Every day | ORAL | Status: DC

## 2011-11-17 NOTE — Telephone Encounter (Signed)
Patient given results and will follow up with Dr. Katrinka Blazing.

## 2011-11-17 NOTE — Progress Notes (Signed)
Addended by: Judi Saa on: 11/17/2011 01:50 PM   Modules accepted: Orders

## 2011-11-23 ENCOUNTER — Encounter: Payer: Self-pay | Admitting: Family Medicine

## 2011-11-23 ENCOUNTER — Ambulatory Visit (INDEPENDENT_AMBULATORY_CARE_PROVIDER_SITE_OTHER): Payer: Self-pay | Admitting: Family Medicine

## 2011-11-23 VITALS — BP 118/82 | HR 74 | Temp 98.4°F | Ht 70.0 in | Wt 208.0 lb

## 2011-11-23 DIAGNOSIS — E0500 Thyrotoxicosis with diffuse goiter without thyrotoxic crisis or storm: Secondary | ICD-10-CM

## 2011-11-23 DIAGNOSIS — E890 Postprocedural hypothyroidism: Secondary | ICD-10-CM

## 2011-11-23 DIAGNOSIS — R5381 Other malaise: Secondary | ICD-10-CM

## 2011-11-23 DIAGNOSIS — R5383 Other fatigue: Secondary | ICD-10-CM

## 2011-11-23 LAB — GLUCOSE, CAPILLARY: Glucose-Capillary: 71 mg/dL (ref 70–99)

## 2011-11-23 NOTE — Assessment & Plan Note (Signed)
Wt Readings from Last 3 Encounters:  11/23/11 208 lb (94.348 kg)  11/10/11 210 lb (95.255 kg)  09/09/11 197 lb (89.359 kg)   Patient has gained 11 pounds over the course of time and has significant findings of hypothyroidism still. At this time will increase to 175 mcg gave patient a potential side effects.patient had questions about taking T3 supplementation told her the risks of that as well as the cost of the medications. Patient will return in 3 weeks' time to make sure she is improving.

## 2011-11-23 NOTE — Progress Notes (Signed)
  Subjective:    Patient ID: Katie Obrien, female    DOB: July 01, 1976, 36 y.o.   MRN: [id number]  HPI 36 year old female who is status post radical thyroidectomy secondary to a thyroid nodule causing hyperthyroidism. Patient has been put on Synthroid since that time has gained weight, felt very fatigued, had swelling, abdominal pain with constipation, and some hair loss. Patient was taken 100 mcg of Synthroid daily last TSH Lab Results  Component Value Date   TSH 29.560* 11/10/2011   Patient was being given a medication by Dr. Gerrit Friends who did the surgery. This is patient's first followup since this time.   Review of Systems As stated above   no changes in history Objective:   Physical Exam  Constitutional: She is oriented to person, place, and time. She appears well-developed and well-nourished.  HENT:  Head: Normocephalic.  Eyes: Conjunctivae and EOM are normal. Pupils are equal, round, and reactive to light.  Neck: Normal range of motion. No thyromegaly present.       Scar is healing very well.  Cardiovascular: Normal rate, regular rhythm and normal heart sounds.   No murmur heard. Pulmonary/Chest: Effort normal and breath sounds normal.  Abdominal: Soft. Bowel sounds are normal.  Musculoskeletal: She exhibits edema.       Trace at ankles bilaterally as well as some minimal swelling around the face.  Lymphadenopathy:    She has no cervical adenopathy.  Neurological: She is alert and oriented to person, place, and time. She has normal reflexes. No cranial nerve deficit.  Skin: Skin is warm and dry.  Psychiatric: She has a normal mood and affect.          Assessment & Plan:

## 2011-11-23 NOTE — Patient Instructions (Signed)
Get the synthroid 175 I think it will help Also come back in 3 weeks and we will see how you are doing.

## 2011-12-08 ENCOUNTER — Encounter (INDEPENDENT_AMBULATORY_CARE_PROVIDER_SITE_OTHER): Payer: Self-pay | Admitting: Surgery

## 2011-12-14 ENCOUNTER — Ambulatory Visit: Payer: Self-pay | Admitting: Family Medicine

## 2011-12-31 ENCOUNTER — Telehealth: Payer: Self-pay | Admitting: Family Medicine

## 2011-12-31 ENCOUNTER — Encounter: Payer: Self-pay | Admitting: Family Medicine

## 2011-12-31 ENCOUNTER — Ambulatory Visit (INDEPENDENT_AMBULATORY_CARE_PROVIDER_SITE_OTHER): Payer: Medicaid Other | Admitting: Family Medicine

## 2011-12-31 VITALS — BP 121/82 | HR 91 | Temp 98.1°F | Ht 70.0 in | Wt 212.0 lb

## 2011-12-31 DIAGNOSIS — F411 Generalized anxiety disorder: Secondary | ICD-10-CM

## 2011-12-31 DIAGNOSIS — G43909 Migraine, unspecified, not intractable, without status migrainosus: Secondary | ICD-10-CM

## 2011-12-31 DIAGNOSIS — E890 Postprocedural hypothyroidism: Secondary | ICD-10-CM

## 2011-12-31 MED ORDER — SUMATRIPTAN SUCCINATE 50 MG PO TABS: 50.0000 mg | ORAL_TABLET | ORAL | Status: DC | PRN

## 2011-12-31 MED ORDER — CLONAZEPAM 0.5 MG PO TABS: 0.5000 mg | ORAL_TABLET | Freq: Two times a day (BID) | ORAL | Status: DC | PRN

## 2011-12-31 MED ORDER — PROMETHAZINE HCL 25 MG/ML IJ SOLN
12.5000 mg | Freq: Once | INTRAMUSCULAR | Status: AC
  Administered 2011-12-31: 12.5 mg via INTRAMUSCULAR

## 2011-12-31 MED ORDER — PROMETHAZINE HCL 25 MG/ML IJ SOLN: 12.5000 mg | Freq: Once | INTRAMUSCULAR | Status: DC

## 2011-12-31 MED ORDER — SUMATRIPTAN SUCCINATE 6 MG/0.5ML ~~LOC~~ SOLN
6.0000 mg | Freq: Once | SUBCUTANEOUS | Status: AC
  Administered 2011-12-31: 6 mg via SUBCUTANEOUS

## 2011-12-31 NOTE — Assessment & Plan Note (Signed)
Anxiety is likely trigger of migraine and tension headache. Has not been on celexa due to GI side effects. Patient requested refill for klonopin which I only refilled for 10 tabs, letting patient know that it should normally be prescribed by her PCP. In this acute setting, where the anxiety appears directly linked with her migraine, I felt like treatment of acute anxiety may benefit her headache as well.  Anxious state could also be affected by thyroid hormone level. Got TSH today.

## 2011-12-31 NOTE — Telephone Encounter (Signed)
It is ok to double book me one more time, I do feel this could wait until end of the month to be seen but if she is not feeling better than she should come back .

## 2011-12-31 NOTE — Progress Notes (Signed)
Patient ID: Katie Obrien, female   DOB: [birth year], 36 y.o.   MRN: [id number] Patient ID: Katie Obrien    DOB: Oct 24, 1975, 36 y.o.   MRN: [id number] --- Subjective:  Katie Obrien is a 36 y.o.female with h/o migraine headaches, hypothyroid s/p thyroidectomy and anxiety who presents with 1 week history of worsening migraine headaches.  - migraine: patient describes throbbing pain, localized at back of head as well as across head. + photophobia, + nausea, - vomiting. Has been taking ibuprofen 50-100 pills per week without relief of symptoms, has also been taking tylenol and goodies powder. She had not been able to afford the sumatriptan, but was recently approved for medicaid and will therefore be able to take it. She has taken sumatriptan in the past which aborted her migraines. When migraine flares in the past have occurred, she has been given a "migraine cocktail" shot which helped. She has had similar episodes like this before. There are no new characteristics to this episode that have not been present before.  - anxiety: she feels that the migraine has been triggered by anxiety that has been worst in the last week. She has been anxious about her health and her hypothyroid state. She feels depressed about her recent weight gain, hair loss and thinning. She has not been taking celexa as the GI side effects were too difficult for her. She has been trying breathing exercises with mild improvement of symptoms.     Objective: Filed Vitals:   12/31/11 1019  BP: 121/82  Pulse: 91  Temp: 98.1 F (36.7 C)    Physical Examination:   General appearance - alert, well appearing, and in no distress Nose - normal and patent, no erythema, discharge or polyps Neck - supple, no significant adenopathy, well healed scar along neck Chest - clear to auscultation, no wheezes, rales or rhonchi, symmetric air entry Heart - normal rate, regular rhythm, normal S1, S2, no murmurs, rubs, clicks or  gallops Neuro - CN 2-12 grossly intact, 5/5 strength in upper and lower extremities bilaterally Head - reproducible tenderness along trapezius up her neck, no temporal tenderness.

## 2011-12-31 NOTE — Assessment & Plan Note (Signed)
On synthroid . Anxiety could be directly linked to hormone level. Checking TSH today

## 2011-12-31 NOTE — Assessment & Plan Note (Signed)
Cause of headache appears to be multifactorial: there seems to be a component of migraine, as this is a typical picture for her. There also seems to be some tension headache given reproducible tenderness along back of her neck and increased stress and anxiety. There also might be a component of rebound headache given that she has been taking a lot of over the counter analgesic (ibuprfen 100tabs per week), tylenol and goodies powder. There are no focal findings on exam prompting for head imaging. There are no new features to the headache making it more alarming.  - treat acutely in the office with sumatriptan 6mg  IM and phenergan 12.5mg  IM  - resent Rx for sumatriptan as she will be able to afford it now that she has medicaid - recommended to cut back on NSAIDs as this was likely creating some rebound headache.  - close follow up with Dr. Katrinka Blazing next week

## 2011-12-31 NOTE — Patient Instructions (Signed)
For the migraine, you are getting here in the office an injection of phenergan to help with the nausea. It will also make you a little sleepy, which should help with the lack of sleep you have been having. You are also getting a shot of sumatriptan to help abort the migraine.  I resent the prescription for the sumatriptan that you can take at home in case of recurrence.  I am also writing for a few pills of klonopin to help with the anxiety.   Come back next week for a follow up appointment with Dr. Katrinka Blazing.

## 2011-12-31 NOTE — Telephone Encounter (Signed)
Pt was seen today by Dr. Gwenlyn Saran.  Was told to come back next week to see you.  You're already doublebookd all next week.  Please advise where to put patient. I will call with appt.

## 2012-01-12 NOTE — Telephone Encounter (Signed)
Called and had to leave message.  Discussed I'm not comfortable leaving info over voicemail.  Did say that labs looked better but kept it vague.

## 2012-01-12 NOTE — Telephone Encounter (Signed)
Pt sched for 4/25 @ 3:15 but want you to call her regarding her levels

## 2012-01-17 ENCOUNTER — Other Ambulatory Visit: Payer: Self-pay | Admitting: Family Medicine

## 2012-01-17 DIAGNOSIS — G43909 Migraine, unspecified, not intractable, without status migrainosus: Secondary | ICD-10-CM

## 2012-01-17 MED ORDER — CLONAZEPAM 0.5 MG PO TABS: 0.5000 mg | ORAL_TABLET | Freq: Two times a day (BID) | ORAL | Status: DC | PRN

## 2012-01-17 NOTE — Telephone Encounter (Signed)
We'll give Klonopin the patient has to pick it up up front. Called patient and told her.

## 2012-01-17 NOTE — Telephone Encounter (Signed)
Pt has an appt for thurs and is asking to have enough Clonopin called in until her appt.  She is completely out. [facility] [place]

## 2012-01-20 ENCOUNTER — Ambulatory Visit: Payer: Medicaid Other | Admitting: Family Medicine

## 2012-02-06 ENCOUNTER — Encounter (HOSPITAL_COMMUNITY): Payer: Self-pay | Admitting: *Deleted

## 2012-02-06 ENCOUNTER — Emergency Department (HOSPITAL_COMMUNITY)
Admission: EM | Admit: 2012-02-06 | Discharge: 2012-02-06 | Disposition: A | Discharge status: Home / Self Care | Payer: Medicaid Other | Attending: Emergency Medicine | Admitting: Emergency Medicine

## 2012-02-06 DIAGNOSIS — E0590 Thyrotoxicosis, unspecified without thyrotoxic crisis or storm: Secondary | ICD-10-CM | POA: Insufficient documentation

## 2012-02-06 DIAGNOSIS — N12 Tubulo-interstitial nephritis, not specified as acute or chronic: Secondary | ICD-10-CM | POA: Insufficient documentation

## 2012-02-06 DIAGNOSIS — R109 Unspecified abdominal pain: Secondary | ICD-10-CM | POA: Insufficient documentation

## 2012-02-06 DIAGNOSIS — I10 Essential (primary) hypertension: Secondary | ICD-10-CM | POA: Insufficient documentation

## 2012-02-06 DIAGNOSIS — M549 Dorsalgia, unspecified: Secondary | ICD-10-CM | POA: Insufficient documentation

## 2012-02-06 DIAGNOSIS — E0500 Thyrotoxicosis with diffuse goiter without thyrotoxic crisis or storm: Secondary | ICD-10-CM | POA: Insufficient documentation

## 2012-02-06 LAB — URINALYSIS, ROUTINE W REFLEX MICROSCOPIC
Glucose, UA: NEGATIVE mg/dL
Hgb urine dipstick: NEGATIVE
Ketones, ur: NEGATIVE mg/dL
Protein, ur: NEGATIVE mg/dL
Urobilinogen, UA: 0.2 mg/dL (ref 0.0–1.0)

## 2012-02-06 MED ORDER — PROMETHAZINE HCL 25 MG PO TABS: 25.0000 mg | ORAL_TABLET | Freq: Four times a day (QID) | ORAL | Status: DC | PRN

## 2012-02-06 MED ORDER — CIPROFLOXACIN HCL 500 MG PO TABS: 500.0000 mg | ORAL_TABLET | Freq: Two times a day (BID) | ORAL | Status: AC

## 2012-02-06 MED ORDER — OXYCODONE-ACETAMINOPHEN 5-325 MG PO TABS: 2.0000 | ORAL_TABLET | ORAL | Status: AC | PRN

## 2012-02-06 MED ORDER — CIPROFLOXACIN HCL 500 MG PO TABS
500.0000 mg | ORAL_TABLET | Freq: Once | ORAL | Status: AC
  Administered 2012-02-06: 500 mg via ORAL
  Filled 2012-02-06: qty 1

## 2012-02-06 MED ORDER — MORPHINE SULFATE 4 MG/ML IJ SOLN
4.0000 mg | Freq: Once | INTRAMUSCULAR | Status: AC
  Administered 2012-02-06: 4 mg via INTRAMUSCULAR
  Filled 2012-02-06: qty 1

## 2012-02-06 MED ORDER — ONDANSETRON 4 MG PO TBDP
4.0000 mg | ORAL_TABLET | Freq: Once | ORAL | Status: AC
  Administered 2012-02-06: 4 mg via ORAL
  Filled 2012-02-06: qty 1

## 2012-02-06 NOTE — ED Provider Notes (Signed)
History   This chart was scribed for Katie Guppy, MD by Melba Coon. The patient was seen in room STRE3/STRE3 and the patient's care was started at 12:57PM.    CSN: [id number]  Arrival date & time 02/06/12  1226   None     Chief Complaint  Patient presents with  . Back Pain    (Consider location/radiation/quality/duration/timing/severity/associated sxs/prior treatment) HPI Katie Obrien is a 36 y.o. female who presents to the Emergency Department complaining of constant, moderate to severe, sharp right flank pain with an onset 3 days ago. Pt has Hx of kidney infections and states that present pain feels similar to past episodes. No fall or trauma to affected area. LNMP: last week. Nml urination present. No HA, fever, neck pain, sore throat, rash, CP, SOB, abd pain, n/v/d, dysuria, or extremity pain, edema, weakness, numbness, or tingling. Hx of thyroidectomy. Allergic to codeine and penicillins. No other pertinent medical symptoms.   Past Medical History  Diagnosis Date  . Migraine   . Hyperthyroidism   . Hypertension   . Grave's disease     Past Surgical History  Procedure Date  . Tubal ligation   . Cesarean section 1998, 2001  . Tonsillectomy and adenoidectomy 1993  . Total thyroidectomy 07/31/11    Family History  Problem Relation Age of Onset  . Cancer Maternal Aunt     ovarian  . Cancer Maternal Grandfather     bone    History  Substance Use Topics  . Smoking status: Current Everyday Smoker -- 0.5 packs/day  . Smokeless tobacco: Never Used  . Alcohol Use: Yes    OB History    Grav Para Term Preterm Abortions TAB SAB Ect Mult Living                  Review of Systems 10 Systems reviewed and all are negative for acute change except as noted in the HPI.   Allergies  Codeine and Penicillins  Home Medications   Current Outpatient Rx  Name Route Sig Dispense Refill  . CLONAZEPAM 0.5 MG PO TABS Oral Take 1 tablet (0.5 mg total) by  mouth 2 (two) times daily as needed for anxiety. 10 tablet 0  . LEVOTHYROXINE SODIUM 175 MCG PO TABS Oral Take 1 tablet (175 mcg total) by mouth daily. 31 tablet 1  . OMEPRAZOLE 20 MG PO CPDR Oral Take 1 capsule (20 mg total) by mouth daily. 90 capsule 2  . SUMATRIPTAN SUCCINATE 50 MG PO TABS Oral Take 1 tablet (50 mg total) by mouth every 2 (two) hours as needed for migraine. But do not take more than 2 pills in a day. 16 tablet 0    BP 132/71  Pulse 102  Temp(Src) 98.3 F (36.8 C) (Oral)  Resp 20  SpO2 98%  Physical Exam  Nursing note and vitals reviewed. Constitutional: She is oriented to person, place, and time. She appears well-developed and well-nourished. No distress.  HENT:  Head: Normocephalic and atraumatic.  Eyes: EOM are normal. Right eye exhibits no discharge. Left eye exhibits no discharge.  Neck: Normal range of motion. Neck supple. No tracheal deviation present.  Cardiovascular: Regular rhythm and normal heart sounds.   No murmur heard. Pulmonary/Chest: Effort normal. No respiratory distress.  Abdominal: Soft. She exhibits no distension. There is no tenderness.  Musculoskeletal: Normal range of motion. She exhibits tenderness (Lower rt paraspinal lumbar tenderness).       No spinal tenderness  Neurological: She is alert and oriented  to person, place, and time.  Skin: Skin is warm and dry.  Psychiatric: She has a normal mood and affect. Her behavior is normal.    ED Course  Procedures (including critical care time) 24 y female with sxs c/w pyelo. Not toxic.  Will check ua  DIAGNOSTIC STUDIES: Oxygen Saturation is 98% on room air, normal by my interpretation.    COORDINATION OF CARE:  1:01PM - EDMD will order UA, pain shot and zofran for the pt.    Labs Reviewed  URINALYSIS, ROUTINE W REFLEX MICROSCOPIC   No results found.   No diagnosis found.    MDM  Right pyelo Not toxic. I personally performed the services described in this documentation,  which was scribed in my presence. The recorded information has been reviewed and considered.         Katie Guppy, MD 02/06/12 1341

## 2012-02-06 NOTE — ED Notes (Signed)
Right flank pain x several days, states sharp pains. Denies any urinary symptoms.

## 2012-02-06 NOTE — Discharge Instructions (Signed)
You do have signs of infection.  Use Cipro for infection and Percocet for pain.  Followup with your Dr. if your symptoms.  Last more than 3-4 days.  Return for worse or uncontrolled symptoms

## 2012-02-08 LAB — URINE CULTURE
Colony Count: >=100000
Culture  Setup Time: 201305122135

## 2012-02-09 NOTE — ED Notes (Signed)
Results received from [facility]. (+) URNC -> >/= 100,000 colonies -> Group B Strep.  Rx for Cipro -> No sensitivities provided.  Chart to MD office for review.

## 2012-02-11 NOTE — ED Notes (Signed)
Voicemail message left

## 2012-02-11 NOTE — ED Notes (Addendum)
Chart reviewed by  Georgie Chard PA  Notes read as follows: Cipro likely should cover but would suggest adding Macrobid 100 mg po BID x 7 days if patient remains symptomatic.

## 2012-02-15 NOTE — ED Notes (Addendum)
+   Urine rx for  Macrobid 100 mg po BID x 7 days # 14 called to [facility] -[phone] written by Grant Fontana by [facility]. PFM

## 2012-02-16 ENCOUNTER — Ambulatory Visit: Payer: Medicaid Other | Admitting: Family Medicine

## 2012-02-16 ENCOUNTER — Other Ambulatory Visit: Payer: Self-pay | Admitting: Family Medicine

## 2012-02-22 ENCOUNTER — Other Ambulatory Visit: Payer: Self-pay | Admitting: Family Medicine

## 2012-02-23 ENCOUNTER — Encounter: Payer: Self-pay | Admitting: Family Medicine

## 2012-02-23 ENCOUNTER — Ambulatory Visit (INDEPENDENT_AMBULATORY_CARE_PROVIDER_SITE_OTHER): Payer: Medicaid Other | Admitting: Family Medicine

## 2012-02-23 VITALS — BP 130/88 | HR 89 | Temp 98.1°F | Ht 70.0 in | Wt 207.0 lb

## 2012-02-23 DIAGNOSIS — M545 Low back pain: Secondary | ICD-10-CM

## 2012-02-23 DIAGNOSIS — F411 Generalized anxiety disorder: Secondary | ICD-10-CM

## 2012-02-23 DIAGNOSIS — F329 Major depressive disorder, single episode, unspecified: Secondary | ICD-10-CM

## 2012-02-23 DIAGNOSIS — E041 Nontoxic single thyroid nodule: Secondary | ICD-10-CM

## 2012-02-23 DIAGNOSIS — E890 Postprocedural hypothyroidism: Secondary | ICD-10-CM

## 2012-02-23 MED ORDER — CEPHALEXIN 500 MG PO CAPS: 500.0000 mg | ORAL_CAPSULE | Freq: Four times a day (QID) | ORAL | Status: AC

## 2012-02-23 MED ORDER — PHENAZOPYRIDINE HCL 100 MG PO TABS: 100.0000 mg | ORAL_TABLET | Freq: Three times a day (TID) | ORAL | Status: AC | PRN

## 2012-02-23 MED ORDER — VENLAFAXINE HCL ER 37.5 MG PO CP24: ORAL_CAPSULE | ORAL | Status: DC

## 2012-02-23 MED ORDER — CLONAZEPAM 0.5 MG PO TABS: 0.5000 mg | ORAL_TABLET | Freq: Two times a day (BID) | ORAL | Status: DC | PRN

## 2012-02-23 NOTE — Progress Notes (Signed)
Patient ID: Katie Obrien, female   DOB: [birth year], 36 y.o.   MRN: [id number]  Subjective:    Patient ID: Katie Obrien, female    DOB: 06/05/1976, 36 y.o.   MRN: [id number]  Back Pain   36 year old female who is status post radical thyroidectomy secondary to a thyroid nodule causing hyperthyroidism. Patient has been put on Synthroid since that time has gained weight, felt very fatigued, had swelling, abdominal pain with constipation, and some hair loss. Patient was taken 175 mcg of Synthroid daily last TSH Lab Results  Component Value Date   TSH 5.689* 12/31/2011   Overweight Wt Readings from Last 3 Encounters:  02/23/12 207 lb (93.895 kg)  12/31/11 212 lb (96.163 kg)  11/23/11 208 lb (94.348 kg)   patient is still attempting to lose weight, watch her diet. [name] Patient states that she's crumbled off and having trouble sleeping and is being much more short with her children. Patient has had a history of depression previously has been treated with multiple things but very results. Patient does not remember what medication seem to help previously. Patient is nervous though she is treated for depression she might gain even more weight. Patient denies any suicidal or homicidal ideation.   Review of Systems  Musculoskeletal: Positive for back pain.   As stated above   no changes in history Objective:   Physical Exam  Constitutional: She is oriented to person, place, and time. She appears well-developed and well-nourished.  HENT:  Head: Normocephalic.  Eyes: Conjunctivae and EOM are normal. Pupils are equal, round, and reactive to light.  Neck: Normal range of motion. No thyromegaly present.       Scar is healing very well.  Cardiovascular: Normal rate, regular rhythm and normal heart sounds.   No murmur heard. Pulmonary/Chest: Effort normal and breath sounds normal.    Abdominal: Soft. Bowel sounds are normal.  Musculoskeletal: She exhibits edema.       Trace at ankles bilaterally as well as some minimal swelling around the face.  Lymphadenopathy:    She has no cervical adenopathy.  Neurological: She is alert and oriented to person, place, and time. She has normal reflexes. No cranial nerve deficit.  Skin: Skin is warm and dry.  Psychiatric: She has a normal mood and affect.   patient though did become very tearful when discussing her depression.    Assessment & Plan:

## 2012-02-23 NOTE — Assessment & Plan Note (Signed)
Patient is taking Synthroid 175 g daily. Do not feel that her weight gain is secondary to her thyroid. Will await another 4-6 weeks until we check again. Patient does have anxiety I think that is more likely due to her depression as well. We will continue to monitor closely. Followup in 4-6 weeks. Patient is going out of town to [place] for some time for the turning shortly.

## 2012-02-23 NOTE — Assessment & Plan Note (Signed)
Feel this is secondary to patient's underlying depression. Refill Klonopin for one month's time encourage her to try to decrease the dose to half tab 2 times a day possible. 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 Hopefully Effexor will also help her anxiety. Patient declined any psychotherapy.

## 2012-02-23 NOTE — Assessment & Plan Note (Signed)
Started Effexor today. We'll titrate up after one week. Patient will followup with me if she can before she lives at home. Otherwise I would like to titrate her up to 150 mg daily. She will be in contact over the phone. Patient knows of potential side effects and when to seek medical attention.

## 2012-02-23 NOTE — Patient Instructions (Signed)
It is good to see you. I have refilled your Klonopin. Please try to only uses as needed. I'm giving you a new medicine called Effexor. Take one pill daily for the first week then 2 pills daily thereafter. I understand you are going hours of state soon. Please give me a call and tell me how the medicine is treating yourself. We can increase the dose again if needed. I'm giving you an antibiotic in case you get another urinary tract infection. I have really enjoyed getting to know you over the course of the last 2 years.

## 2012-02-23 NOTE — Assessment & Plan Note (Signed)
Patient is taking Synthroid 175 g daily. Do not feel that her weight gain is secondary to her thyroid. Will await another 4-6 weeks until we check again. Patient does have anxiety I think that is more likely due to her depression as well. We will continue to monitor closely. Followup in 4-6 weeks. Patient is going out of town to [place] for some time for the turning shortly. 

## 2012-02-27 ENCOUNTER — Emergency Department (HOSPITAL_COMMUNITY): Payer: 59

## 2012-02-27 ENCOUNTER — Encounter (HOSPITAL_COMMUNITY): Payer: Self-pay | Admitting: Emergency Medicine

## 2012-02-27 ENCOUNTER — Emergency Department (HOSPITAL_COMMUNITY)
Admission: EM | Admit: 2012-02-27 | Discharge: 2012-02-27 | Disposition: A | Discharge status: Home / Self Care | Payer: 59 | Attending: Emergency Medicine | Admitting: Emergency Medicine

## 2012-02-27 DIAGNOSIS — F10929 Alcohol use, unspecified with intoxication, unspecified: Secondary | ICD-10-CM

## 2012-02-27 DIAGNOSIS — E0590 Thyrotoxicosis, unspecified without thyrotoxic crisis or storm: Secondary | ICD-10-CM | POA: Insufficient documentation

## 2012-02-27 DIAGNOSIS — M545 Low back pain, unspecified: Secondary | ICD-10-CM | POA: Insufficient documentation

## 2012-02-27 DIAGNOSIS — R109 Unspecified abdominal pain: Secondary | ICD-10-CM | POA: Insufficient documentation

## 2012-02-27 DIAGNOSIS — F1010 Alcohol abuse, uncomplicated: Secondary | ICD-10-CM | POA: Insufficient documentation

## 2012-02-27 DIAGNOSIS — I10 Essential (primary) hypertension: Secondary | ICD-10-CM | POA: Insufficient documentation

## 2012-02-27 DIAGNOSIS — Z79899 Other long term (current) drug therapy: Secondary | ICD-10-CM | POA: Insufficient documentation

## 2012-02-27 HISTORY — DX: Thyrotoxicosis with diffuse goiter without thyrotoxic crisis or storm: E05.00

## 2012-02-27 LAB — POCT I-STAT, CHEM 8
Calcium, Ion: 0.98 mmol/L — ABNORMAL LOW (ref 1.12–1.32)
Chloride: 103 mEq/L (ref 96–112)
Glucose, Bld: 90 mg/dL (ref 70–99)
HCT: 38 % (ref 36.0–46.0)

## 2012-02-27 LAB — URINALYSIS, ROUTINE W REFLEX MICROSCOPIC
Glucose, UA: NEGATIVE mg/dL
Ketones, ur: NEGATIVE mg/dL
Protein, ur: NEGATIVE mg/dL

## 2012-02-27 LAB — URINE MICROSCOPIC-ADD ON

## 2012-02-27 LAB — CBC
HCT: 34 % — ABNORMAL LOW (ref 36.0–46.0)
MCH: 24.9 pg — ABNORMAL LOW (ref 26.0–34.0)
MCHC: 32.4 g/dL (ref 30.0–36.0)
MCV: 77.1 fL — ABNORMAL LOW (ref 78.0–100.0)
RDW: 18.6 % — ABNORMAL HIGH (ref 11.5–15.5)

## 2012-02-27 MED ORDER — SODIUM CHLORIDE 0.9 % IV BOLUS (SEPSIS)
1000.0000 mL | Freq: Once | INTRAVENOUS | Status: AC
  Administered 2012-02-27: 1000 mL via INTRAVENOUS

## 2012-02-27 MED ORDER — ONDANSETRON 4 MG PO TBDP
8.0000 mg | ORAL_TABLET | Freq: Once | ORAL | Status: AC
  Administered 2012-02-27: 8 mg via ORAL
  Filled 2012-02-27: qty 2

## 2012-02-27 MED ORDER — ONDANSETRON 4 MG PO TBDP
ORAL_TABLET | ORAL | Status: AC
  Administered 2012-02-27: 4 mg
  Filled 2012-02-27: qty 1

## 2012-02-27 MED ORDER — KETOROLAC TROMETHAMINE 30 MG/ML IJ SOLN
30.0000 mg | Freq: Once | INTRAMUSCULAR | Status: AC
  Administered 2012-02-27: 30 mg via INTRAVENOUS
  Filled 2012-02-27: qty 1

## 2012-02-27 NOTE — ED Notes (Signed)
Pt brought to ED by EMS with vomiting(intoxicated) the patient was found outside by friend in the parking area.She was given 4mg  of Zofran and of saline.

## 2012-02-27 NOTE — Discharge Instructions (Signed)
Alcohol Intoxication   You have alcohol intoxication when the amount of alcohol that you have consumed has impaired your ability to mentally and physically function. There are a variety of factors that contribute to the level at which alcohol intoxication can occur, such as age, gender, weight, frequency of alcohol consumption, medication use, and the presence of other medical conditions, such as diabetes, seizures, or heart conditions.   The blood alcohol level test measures the concentration of alcohol in your blood. In most states, your blood alcohol level must be lower than 80 mg/dL (0.08%) to legally drive. However, many dangerous effects of alcohol can occur at much lower levels.   Alcohol directly impairs the normal chemical activity of the brain and is said to be a chemical depressant. Alcohol can cause drowsiness, stupor, respiratory failure, and coma. Other physical effects can include headache, vomiting, vomiting of blood, abdominal pain, a fast heartbeat, difficulty breathing, anxiety, and amnesia. Alcohol intoxication can also lead to dangerous and life-threatening activities, such as fighting, dangerous operation of vehicles or heavy machinery, and risky sexual behavior.   Alcohol can be especially dangerous when taken with other drugs. Some of these drugs are:   Sedatives.   Painkillers.   Marijuana.   Tranquilizers.   Antihistamines.   Muscle relaxants.   Seizure medicine.  Many of the effects of acute alcohol intoxication are temporary. However, repeated alcohol intoxication can lead to severe medical illnesses. If you have alcohol intoxication, you should:   Stay hydrated. Drink enough water and fluids to keep your urine clear or pale yellow. Avoid excessive caffeine because this can further lead to dehydration.   Eat a healthy diet. You may have residual nausea, headache, and loss of appetite, but it is still important that you maintain good nutrition. You can start with clear liquids.   Take  nonsteroidal anti-inflammatory medications as needed for headaches, but make sure to do so with small meals. You should avoid acetaminophen for several days after having alcohol intoxication because the combination of alcohol and acetaminophen can be toxic to your liver.  If you have frequent alcohol intoxication, ask your friends and family if they think you have a drinking problem. For further help, contact:   Your caregiver.   Alcoholics Anonymous (AA).   A drug or alcohol rehabilitation program.  SEEK MEDICAL CARE IF:   You have persistent vomiting.   You have persistent pain in any part of your body.   You do not feel better after a few days.  SEEK IMMEDIATE MEDICAL CARE IF:   You become shaky or tremble when you try to stop drinking.   You shake uncontrollably (seizure).   You throw up (vomit) blood. This may be bright red or it may look like black coffee grounds.   You have blood in the stool. This may be bright red or appear as a black, tarry, bad smelling stool.   You become lightheaded or faint.  ANY OF THESE SYMPTOMS MAY REPRESENT A SERIOUS PROBLEM THAT IS AN EMERGENCY. Do not wait to see if the symptoms will go away. Get medical help right away. Call your local emergency services (911 in [place] DO NOT drive yourself to the hospital.   MAKE SURE YOU:   Understand these instructions.   Will watch your condition.   Will get help right away if you are not doing well or get worse.

## 2012-02-27 NOTE — ED Provider Notes (Addendum)
History     CSN: [id number]  Arrival date & time 02/27/12  0054   First MD Initiated Contact with Patient 02/27/12 0109      Chief Complaint  Patient presents with  . Alcohol Intoxication    (Consider location/radiation/quality/duration/timing/severity/associated sxs/prior treatment) HPI History provided by patient. Has been drinking alcohol tonight was found by a friend who called EMS because she was vomiting. Patient is very anxious and concerned about right lower back and flank pain with recent complicated UTI. She's had multiple rounds of antibiotics with persistent UTI. She has nausea and vomiting tonight. She denies any recent fevers. Pain is sharp in quality and not relieved by medications at home. Past Medical History  Diagnosis Date  . Migraine   . Hyperthyroidism   . Hypertension   . Grave's disease   . Graves disease     Past Surgical History  Procedure Date  . Tubal ligation   . Cesarean section 1998, 2001  . Tonsillectomy and adenoidectomy 1993  . Total thyroidectomy 07/31/11    Family History  Problem Relation Age of Onset  . Cancer Maternal Aunt     ovarian  . Cancer Maternal Grandfather     bone    History  Substance Use Topics  . Smoking status: Current Everyday Smoker -- 0.5 packs/day  . Smokeless tobacco: Never Used  . Alcohol Use: Yes    OB History    Grav Para Term Preterm Abortions TAB SAB Ect Mult Living                  Review of Systems  Constitutional: Negative for fever and chills.  HENT: Negative for neck pain and neck stiffness.   Eyes: Negative for pain.  Respiratory: Negative for shortness of breath.   Cardiovascular: Negative for chest pain.  Gastrointestinal: Positive for vomiting. Negative for abdominal distention.  Genitourinary: Negative for dysuria.  Musculoskeletal: Negative for back pain.  Skin: Negative for rash.  Neurological: Negative for headaches.  All other systems reviewed and are negative.    Allergies    Codeine and Penicillins  Home Medications   Current Outpatient Rx  Name Route Sig Dispense Refill  . CEPHALEXIN 500 MG PO CAPS Oral Take 1 capsule (500 mg total) by mouth 4 (four) times daily. 14 capsule 0  . CLONAZEPAM 0.5 MG PO TABS Oral Take 1 tablet (0.5 mg total) by mouth 2 (two) times daily as needed for anxiety. 60 tablet 0  . LEVOTHYROXINE SODIUM 175 MCG PO TABS  take 1 tablet once daily 31 tablet 0  . VENLAFAXINE HCL ER 37.5 MG PO CP24  Take one pill daily for the first week and then 2 pills daily thereafter. 60 capsule 1  . CLONAZEPAM 0.5 MG PO TABS Oral Take 0.5 mg by mouth 2 (two) times daily as needed.    Marland Kitchen PHENAZOPYRIDINE HCL 100 MG PO TABS Oral Take 1 tablet (100 mg total) by mouth 3 (three) times daily as needed for pain. 10 tablet 0  . PROMETHAZINE HCL 25 MG PO TABS Oral Take 1 tablet (25 mg total) by mouth every 6 (six) hours as needed for nausea. 30 tablet 0  . PROMETHAZINE HCL 25 MG PO TABS Oral Take 1 tablet (25 mg total) by mouth every 6 (six) hours as needed for nausea. 12 tablet 0    BP 118/76  Pulse 89  Temp(Src) 97 F (36.1 C) (Oral)  Resp 18  SpO2 98%  LMP 02/23/2012  Physical Exam  Constitutional: She is oriented to person, place, and time. She appears well-developed and well-nourished.  HENT:  Head: Normocephalic and atraumatic.  Eyes: Conjunctivae and EOM are normal. Pupils are equal, round, and reactive to light.  Neck: Trachea normal. Neck supple. No thyromegaly present.  Cardiovascular: Normal rate, regular rhythm, S1 normal, S2 normal and normal pulses.     No systolic murmur is present   No diastolic murmur is present  Pulses:      Radial pulses are 2+ on the right side, and 2+ on the left side.  Pulmonary/Chest: Effort normal and breath sounds normal. She has no wheezes. She has no rhonchi. She has no rales. She exhibits no tenderness.  Abdominal: Soft. Normal appearance and bowel sounds are normal. There is no tenderness. There is no CVA  tenderness and negative Murphy's sign.  Musculoskeletal:       BLE:s Calves nontender, no cords or erythema, negative Homans sign  Neurological: She is alert and oriented to person, place, and time. She has normal strength. No cranial nerve deficit or sensory deficit. GCS eye subscore is 4. GCS verbal subscore is 5. GCS motor subscore is 6.  Skin: Skin is warm and dry. No rash noted. She is not diaphoretic.  Psychiatric: Her speech is normal.       Cooperative and appropriate    ED Course  Procedures (including critical care time)  Results for orders placed during the hospital encounter of 02/27/12  URINALYSIS, ROUTINE W REFLEX MICROSCOPIC      Component Value Range   Color, Urine YELLOW  YELLOW    APPearance CLOUDY (*) CLEAR    Specific Gravity, Urine 1.008  1.005 - 1.030    pH 5.5  5.0 - 8.0    Glucose, UA NEGATIVE  NEGATIVE (mg/dL)   Hgb urine dipstick LARGE (*) NEGATIVE    Bilirubin Urine NEGATIVE  NEGATIVE    Ketones, ur NEGATIVE  NEGATIVE (mg/dL)   Protein, ur NEGATIVE  NEGATIVE (mg/dL)   Urobilinogen, UA 0.2  0.0 - 1.0 (mg/dL)   Nitrite NEGATIVE  NEGATIVE    Leukocytes, UA SMALL (*) NEGATIVE   PREGNANCY, URINE      Component Value Range   Preg Test, Ur NEGATIVE  NEGATIVE   POCT I-STAT, CHEM 8      Component Value Range   Sodium 143  135 - 145 (mEq/L)   Potassium 3.5  3.5 - 5.1 (mEq/L)   Chloride 103  96 - 112 (mEq/L)   BUN 4 (*) 6 - 23 (mg/dL)   Creatinine, Ser 1.61  0.50 - 1.10 (mg/dL)   Glucose, Bld 90  70 - 99 (mg/dL)   Calcium, Ion 0.96 (*) 1.12 - 1.32 (mmol/L)   TCO2 24  0 - 100 (mmol/L)   Hemoglobin 12.9  12.0 - 15.0 (g/dL)   HCT 04.5  40.9 - 81.1 (%)  CBC      Component Value Range   WBC 6.9  4.0 - 10.5 (K/uL)   RBC 4.41  3.87 - 5.11 (MIL/uL)   Hemoglobin 11.0 (*) 12.0 - 15.0 (g/dL)   HCT 91.4 (*) 78.2 - 46.0 (%)   MCV 77.1 (*) 78.0 - 100.0 (fL)   MCH 24.9 (*) 26.0 - 34.0 (pg)   MCHC 32.4  30.0 - 36.0 (g/dL)   RDW 95.6 (*) 21.3 - 15.5 (%)   Platelets  378  150 - 400 (K/uL)  URINE MICROSCOPIC-ADD ON      Component Value Range   Squamous Epithelial /  LPF RARE  RARE    WBC, UA 3-6  <3 (WBC/hpf)   RBC / HPF 3-6  <3 (RBC/hpf)   Bacteria, UA RARE  RARE    Ct Abdomen Pelvis Wo Contrast  02/27/2012  *RADIOLOGY REPORT*  Clinical Data: Intoxication and vomiting.  Right flank pain.  CT ABDOMEN AND PELVIS WITHOUT CONTRAST  Technique:  Multidetector CT imaging of the abdomen and pelvis was performed following the standard protocol without intravenous contrast.  Comparison: 04/03/2010  Findings: Probable dependent atelectasis and motion artifact in the lung bases.  Emphysematous changes.  Mild wall thickening of the distal esophagus which might reflect reflux disease.  The kidneys appear symmetrical.  No renal, ureteral, or bladder stones are appreciated.  No pyelocaliectasis or ureterectasis.  The unenhanced appearance of the liver, spleen, gallbladder, pancreas, adrenal glands, abdominal aorta, and retroperitoneal lymph nodes is unremarkable.  The stomach, small bowel, and colon are not abnormally distended.  No free air or free fluid in the abdomen.  Pelvis:  The uterus and adnexal structures are not enlarged.  No bladder wall thickening.  No free or loculated pelvic fluid collections.  The appendix is not visualized.  No inflammatory changes in the right lower quadrant.  There is some prominence of the pelvic and groin lymph nodes bilaterally.  Right groin nodes measure up to 12 mm transverse diameter.  Right external iliac nodes measure up to 15 mm short axis dimension.  Left iliac nodes are smaller, measuring up to about 6 mm dimension.  These are nonspecific and could represent reactive nodes.  Neoplasm is not excluded.  The appearance is similar to the previous study.  IMPRESSION: No renal or ureteral stone or obstruction.  Mild wall thickening of the distal esophagus may represent reflux esophagitis.  Nonspecific mild enlargement of pelvic lymph nodes.  Stable  appearance since previous study.  Original Report Authenticated By: Marlon Pel, M.D.   Patient presents with alcohol intoxication and right-sided low back pain - she is very worried that she has persistent UTI and possible kidney infection. IV fluids provided with IV Zofran. Workup as above with UA, labs and CT scan reviewed. Old records reviewed.  Patient currently on Keflex for UTI diagnosed yesterday in the clinic   MDM   Alcohol intoxication workup as above. Plan discharge home when clinically sober continue medications as prescribed and for close followup and primary care office.        Sunnie Nielsen, MD 02/27/12 1610  Sunnie Nielsen, MD 05/25/12 [phone]

## 2012-02-28 ENCOUNTER — Emergency Department (HOSPITAL_COMMUNITY)
Admission: EM | Admit: 2012-02-28 | Discharge: 2012-02-28 | Disposition: A | Discharge status: Home / Self Care | Payer: Medicaid Other | Attending: Emergency Medicine | Admitting: Emergency Medicine

## 2012-02-28 ENCOUNTER — Encounter (HOSPITAL_COMMUNITY): Payer: Self-pay | Admitting: Emergency Medicine

## 2012-02-28 DIAGNOSIS — N390 Urinary tract infection, site not specified: Secondary | ICD-10-CM | POA: Insufficient documentation

## 2012-02-28 DIAGNOSIS — R109 Unspecified abdominal pain: Secondary | ICD-10-CM | POA: Insufficient documentation

## 2012-02-28 DIAGNOSIS — E0500 Thyrotoxicosis with diffuse goiter without thyrotoxic crisis or storm: Secondary | ICD-10-CM | POA: Insufficient documentation

## 2012-02-28 DIAGNOSIS — F17200 Nicotine dependence, unspecified, uncomplicated: Secondary | ICD-10-CM | POA: Insufficient documentation

## 2012-02-28 DIAGNOSIS — I10 Essential (primary) hypertension: Secondary | ICD-10-CM | POA: Insufficient documentation

## 2012-02-28 LAB — URINALYSIS, ROUTINE W REFLEX MICROSCOPIC
Glucose, UA: 250 mg/dL — AB
Hgb urine dipstick: NEGATIVE
Specific Gravity, Urine: 1.01 (ref 1.005–1.030)
pH: 6.5 (ref 5.0–8.0)

## 2012-02-28 LAB — BASIC METABOLIC PANEL
Chloride: 104 mEq/L (ref 96–112)
GFR calc Af Amer: >90 mL/min (ref >90)
GFR calc non Af Amer: 81 mL/min — ABNORMAL LOW (ref >90)
Potassium: 4 mEq/L (ref 3.5–5.1)
Sodium: 140 mEq/L (ref 135–145)

## 2012-02-28 LAB — PREGNANCY, URINE: Preg Test, Ur: NEGATIVE

## 2012-02-28 LAB — URINE MICROSCOPIC-ADD ON

## 2012-02-28 LAB — POCT PREGNANCY, URINE: Preg Test, Ur: NEGATIVE

## 2012-02-28 MED ORDER — HYDROMORPHONE HCL PF 1 MG/ML IJ SOLN
1.0000 mg | Freq: Once | INTRAMUSCULAR | Status: AC
  Administered 2012-02-28: 1 mg via INTRAVENOUS
  Filled 2012-02-28: qty 1

## 2012-02-28 MED ORDER — ONDANSETRON HCL 4 MG/2ML IJ SOLN
4.0000 mg | Freq: Once | INTRAMUSCULAR | Status: AC
  Administered 2012-02-28: 4 mg via INTRAVENOUS
  Filled 2012-02-28: qty 2

## 2012-02-28 MED ORDER — HYDROCODONE-ACETAMINOPHEN 5-325 MG PO TABS: 1.0000 | ORAL_TABLET | ORAL | Status: AC | PRN

## 2012-02-28 MED ORDER — KETOROLAC TROMETHAMINE 30 MG/ML IJ SOLN
30.0000 mg | Freq: Once | INTRAMUSCULAR | Status: AC
  Administered 2012-02-28: 30 mg via INTRAVENOUS
  Filled 2012-02-28: qty 1

## 2012-02-28 MED ORDER — HYDROMORPHONE HCL PF 1 MG/ML IJ SOLN
1.0000 mg | Freq: Once | INTRAMUSCULAR | Status: DC
  Filled 2012-02-28: qty 1

## 2012-02-28 MED ORDER — SODIUM CHLORIDE 0.9 % IV SOLN
INTRAVENOUS | Status: DC
  Administered 2012-02-28: 09:00:00 via INTRAVENOUS

## 2012-02-28 MED ORDER — SULFAMETHOXAZOLE-TRIMETHOPRIM 800-160 MG PO TABS: 1.0000 | ORAL_TABLET | Freq: Two times a day (BID) | ORAL | Status: AC

## 2012-02-28 MED ORDER — DEXTROSE 5 % IV SOLN
1.0000 g | Freq: Once | INTRAVENOUS | Status: AC
  Administered 2012-02-28: 1 g via INTRAVENOUS
  Filled 2012-02-28: qty 10

## 2012-02-28 NOTE — ED Notes (Signed)
Foley removed. Pt has total of 100 cc urine total in bag. When pt asked again about taking medications that may turn her urine orange, she states she has taken two pyridium.

## 2012-02-28 NOTE — Discharge Instructions (Signed)
Urinary Tract Infection Infections of the urinary tract can start in several places. A bladder infection (cystitis), a kidney infection (pyelonephritis), and a prostate infection (prostatitis) are different types of urinary tract infections (UTIs). They usually get better if treated with medicines (antibiotics) that kill germs. Take all the medicine until it is gone. You or your child may feel better in a few days, but TAKE ALL MEDICINE or the infection may not respond and may become more difficult to treat. HOME CARE INSTRUCTIONS   Drink enough water and fluids to keep the urine clear or pale yellow. Cranberry juice is especially recommended, in addition to large amounts of water.   Avoid caffeine, tea, and carbonated beverages. They tend to irritate the bladder.   Alcohol may irritate the prostate.   Only take over-the-counter or prescription medicines for pain, discomfort, or fever as directed by your caregiver.  To prevent further infections:  Empty the bladder often. Avoid holding urine for long periods of time.   After a bowel movement, women should cleanse from front to back. Use each tissue only once.   Empty the bladder before and after sexual intercourse.  FINDING OUT THE RESULTS OF YOUR TEST Not all test results are available during your visit. If your or your child's test results are not back during the visit, make an appointment with your caregiver to find out the results. Do not assume everything is normal if you have not heard from your caregiver or the medical facility. It is important for you to follow up on all test results. SEEK MEDICAL CARE IF:   There is back pain.   Your baby is older than 3 months with a rectal temperature of 100.5 F (38.1 C) or higher for more than 1 day.   Your or your child's problems (symptoms) are no better in 3 days. Return sooner if you or your child is getting worse.  SEEK IMMEDIATE MEDICAL CARE IF:   There is severe back pain or lower  abdominal pain.   You or your child develops chills.   You have a fever.   Your baby is older than 3 months with a rectal temperature of 102 F (38.9 C) or higher.   Your baby is 78 months old or younger with a rectal temperature of 100.4 F (38 C) or higher.   There is nausea or vomiting.   There is continued burning or discomfort with urination.  MAKE SURE YOU:   Understand these instructions.   Will watch your condition.   Will get help right away if you are not doing well or get worse.  Document Released: 06/23/2005 Document Revised: 09/02/2011 Document Reviewed: 01/26/2007 [facility] Patient Information 2012 [place], [place].   Start using the new antibiotic prescription in place of your Keflex.  You may use hydrocodone for pain, do not drive within 4 hours of taking this medication as it will make you drowsy.  Call your doctor for a recheck of your symptoms later this week.  He may need a referral to a urologist if your symptoms persist and Dr. Katrinka Blazing can make this referral if he deems it appropriate.

## 2012-02-28 NOTE — ED Notes (Signed)
PA in with pt 

## 2012-02-28 NOTE — ED Notes (Signed)
History of kidney infections. Pt seen recently for same

## 2012-03-01 NOTE — ED Provider Notes (Signed)
History     CSN: [id number]  Arrival date & time 02/28/12  4098   First MD Initiated Contact with Patient 02/28/12 [phone]      Chief Complaint  Patient presents with  . Flank Pain    bilateral    (Consider location/radiation/quality/duration/timing/severity/associated sxs/prior treatment) HPI Comments: Katie Obrien presents for evaluation of urinary tract infection which she does not believe is improving despite multiple antibiotics over the past 3 weeks.  She was placed on cipro on 02/09/12,  But remained symptomatic,  So was switched to macrobid on the 17th once the urine culture had returned. After this was completed,  She was placed on keflex by her pcp which she as finished today.   She continues to have painful burning urination and low back pain,  But states she also is not urinating enough (stating she urinates only when she wakes in the morning,  Then the rest of the day,  Frequent "drops" of urine).  She is concerned she may have problems with her kidneys.  She denies fevers and chills.  She also denies any vaginal discomfort or discharge.  The history is provided by the patient.    Past Medical History  Diagnosis Date  . Migraine   . Hyperthyroidism   . Hypertension   . Grave's disease   . Graves disease     Past Surgical History  Procedure Date  . Tubal ligation   . Cesarean section 1998, 2001  . Tonsillectomy and adenoidectomy 1993  . Total thyroidectomy 07/31/11    Family History  Problem Relation Age of Onset  . Cancer Maternal Aunt     ovarian  . Cancer Maternal Grandfather     bone    History  Substance Use Topics  . Smoking status: Current Everyday Smoker -- 0.5 packs/day  . Smokeless tobacco: Never Used  . Alcohol Use: Yes    OB History    Grav Para Term Preterm Abortions TAB SAB Ect Mult Living                  Review of Systems  Constitutional: Negative for fever.  HENT: Negative for congestion, sore throat and neck pain.   Eyes:  Negative.   Respiratory: Negative for chest tightness and shortness of breath.   Cardiovascular: Negative for chest pain.  Gastrointestinal: Negative for nausea and abdominal pain.  Genitourinary: Positive for dysuria, decreased urine volume and difficulty urinating. Negative for hematuria and flank pain.  Musculoskeletal: Negative for joint swelling and arthralgias.  Skin: Negative.  Negative for rash and wound.  Neurological: Negative for dizziness, weakness, light-headedness, numbness and headaches.  Hematological: Negative.   Psychiatric/Behavioral: Negative.     Allergies  Codeine and Penicillins  Home Medications   Current Outpatient Rx  Name Route Sig Dispense Refill  . CEPHALEXIN 500 MG PO CAPS Oral Take 1 capsule (500 mg total) by mouth 4 (four) times daily. 14 capsule 0  . CLONAZEPAM 0.5 MG PO TABS Oral Take 1 tablet (0.5 mg total) by mouth 2 (two) times daily as needed for anxiety. 60 tablet 0  . LEVOTHYROXINE SODIUM 175 MCG PO TABS  take 1 tablet once daily 31 tablet 0  . PHENAZOPYRIDINE HCL 100 MG PO TABS Oral Take 100 mg by mouth 3 (three) times daily as needed. For pain    . PROMETHAZINE HCL 25 MG PO TABS Oral Take 25 mg by mouth every 6 (six) hours as needed. For nausea associated with kidney issues    .  VENLAFAXINE HCL ER 37.5 MG PO CP24 Oral Take 37.5-75 mg by mouth daily. Take one capsule daily for the first week and then 2 capsules daily thereafter.    Marland Kitchen HYDROCODONE-ACETAMINOPHEN 5-325 MG PO TABS Oral Take 1 tablet by mouth every 4 (four) hours as needed for pain. 20 tablet 0  . SULFAMETHOXAZOLE-TRIMETHOPRIM 800-160 MG PO TABS Oral Take 1 tablet by mouth 2 (two) times daily. 28 tablet 0    BP 133/86  Pulse 80  Temp(Src) 98.6 F (37 C) (Oral)  Resp 22  Ht 5\' 10"  (1.778 m)  Wt 200 lb (90.719 kg)  BMI 28.70 kg/m2  SpO2 99%  LMP 02/23/2012  Physical Exam  Nursing note and vitals reviewed. Constitutional: She appears well-developed and well-nourished.  HENT:    Head: Normocephalic and atraumatic.  Eyes: Conjunctivae are normal.  Neck: Normal range of motion.  Cardiovascular: Normal rate, regular rhythm, normal heart sounds and intact distal pulses.   Pulmonary/Chest: Effort normal and breath sounds normal. She has no wheezes.  Abdominal: Soft. Bowel sounds are normal. There is tenderness in the suprapubic area. There is no CVA tenderness.       No cva tenderness, but patient is ttp across lower bilateral  paralumbar region.  She also has slight ttp suprapubic with no appreciable bladder distention.  Musculoskeletal: Normal range of motion.  Neurological: She is alert.  Skin: Skin is warm and dry.  Psychiatric: She has a normal mood and affect.    ED Course  Procedures (including critical care time)  Labs Reviewed  URINALYSIS, ROUTINE W REFLEX MICROSCOPIC - Abnormal; Notable for the following:    Color, Urine ORANGE (*) BIOCHEMICALS MAY BE AFFECTED BY COLOR   Glucose, UA 250 (*)    Ketones, ur TRACE (*)    Protein, ur 100 (*)    Urobilinogen, UA 4.0 (*)    Nitrite POSITIVE (*)    Leukocytes, UA SMALL (*)    All other components within normal limits  BASIC METABOLIC PANEL - Abnormal; Notable for the following:    Calcium 8.1 (*)    GFR calc non Af Amer 81 (*)    All other components within normal limits  URINE MICROSCOPIC-ADD ON - Abnormal; Notable for the following:    Bacteria, UA MANY (*)    All other components within normal limits  PREGNANCY, URINE  POCT PREGNANCY, URINE  LAB REPORT - SCANNED   No results found.   1. UTI (lower urinary tract infection)    Foley cath placed by RN secondary to concern for possible urinary retention with only 100 cc of orange urine obtained (on pyridium).   UA still positive for uti - rocephin 1 gram IV given prior to dc home.     MDM  Septra Ds prescribed,  Hydrocodone for pain.  Pt was desirous of obtaining US of her kidney's today as "her pcp told her she may need one".  Review of recent  ed visits - pt was here 2 days ago and had a Ct abdomen to assess for urinary tree pathology including kidney stones - this study was normal,  reassurance given pt that Ct is very sensitive for assessing for kidney infection/stones,  Other pathology.  Also reassured that her blood tests normal for any sign of kidney stress or malfunction.  Pt states will f/u with pcp tomorrow.  Encouraged fluids,  Start new abx today.  Pt agrees.   Labs reviewed prior to dc home.      Raynelle Fanning  Torah Pinnock, PA 03/01/12 1302  Burgess Amor, [place] 03/01/12 1303

## 2012-03-03 NOTE — ED Provider Notes (Signed)
Medical screening examination/treatment/procedure(s) were performed by non-physician practitioner and as supervising physician I was immediately available for consultation/collaboration.  [name], MD 03/03/12 [phone]

## 2012-03-24 ENCOUNTER — Telehealth: Payer: Self-pay | Admitting: Family Medicine

## 2012-03-24 MED ORDER — LEVOTHYROXINE SODIUM 175 MCG PO TABS: 175.0000 ug | ORAL_TABLET | Freq: Every day | ORAL | Status: DC

## 2012-03-24 NOTE — Telephone Encounter (Signed)
Patient is currently out of town in [place], [place] and will be there a couple of months.  Need refill on her levothyroxine sent to [facility] in [place], [place] .  Ph# is [phone].  Need to call patient if there is any problem.

## 2012-03-24 NOTE — Telephone Encounter (Signed)
Will try give 90 day supply.

## 2012-07-03 ENCOUNTER — Other Ambulatory Visit: Payer: Self-pay | Admitting: Family Medicine

## 2012-07-03 MED ORDER — LEVOTHYROXINE SODIUM 175 MCG PO TABS: 175.0000 ug | ORAL_TABLET | Freq: Every day | ORAL | Status: DC

## 2012-07-03 NOTE — Telephone Encounter (Signed)
Rx for one month supply sent to [facility].  Patient notified she needs to be seen soon for more refills.

## 2012-07-03 NOTE — Telephone Encounter (Signed)
Patient is calling for a refill on her Lexothyroxine sent to [facility].  She is currently in [place].

## 2012-07-25 ENCOUNTER — Ambulatory Visit: Payer: 59 | Admitting: Family Medicine

## 2012-11-28 IMAGING — CR DG CHEST 2V
2 series · 2 of 2 positions shown · non-contrast
Comparison: 04/03/2010

CLINICAL DATA: Preoperative evaluation for thyroidectomy on
07/31/2011.  13 pack year history of smoking.  Tachycardia and
shortness of breath.

CHEST - 2 VIEW

[w chest pa]
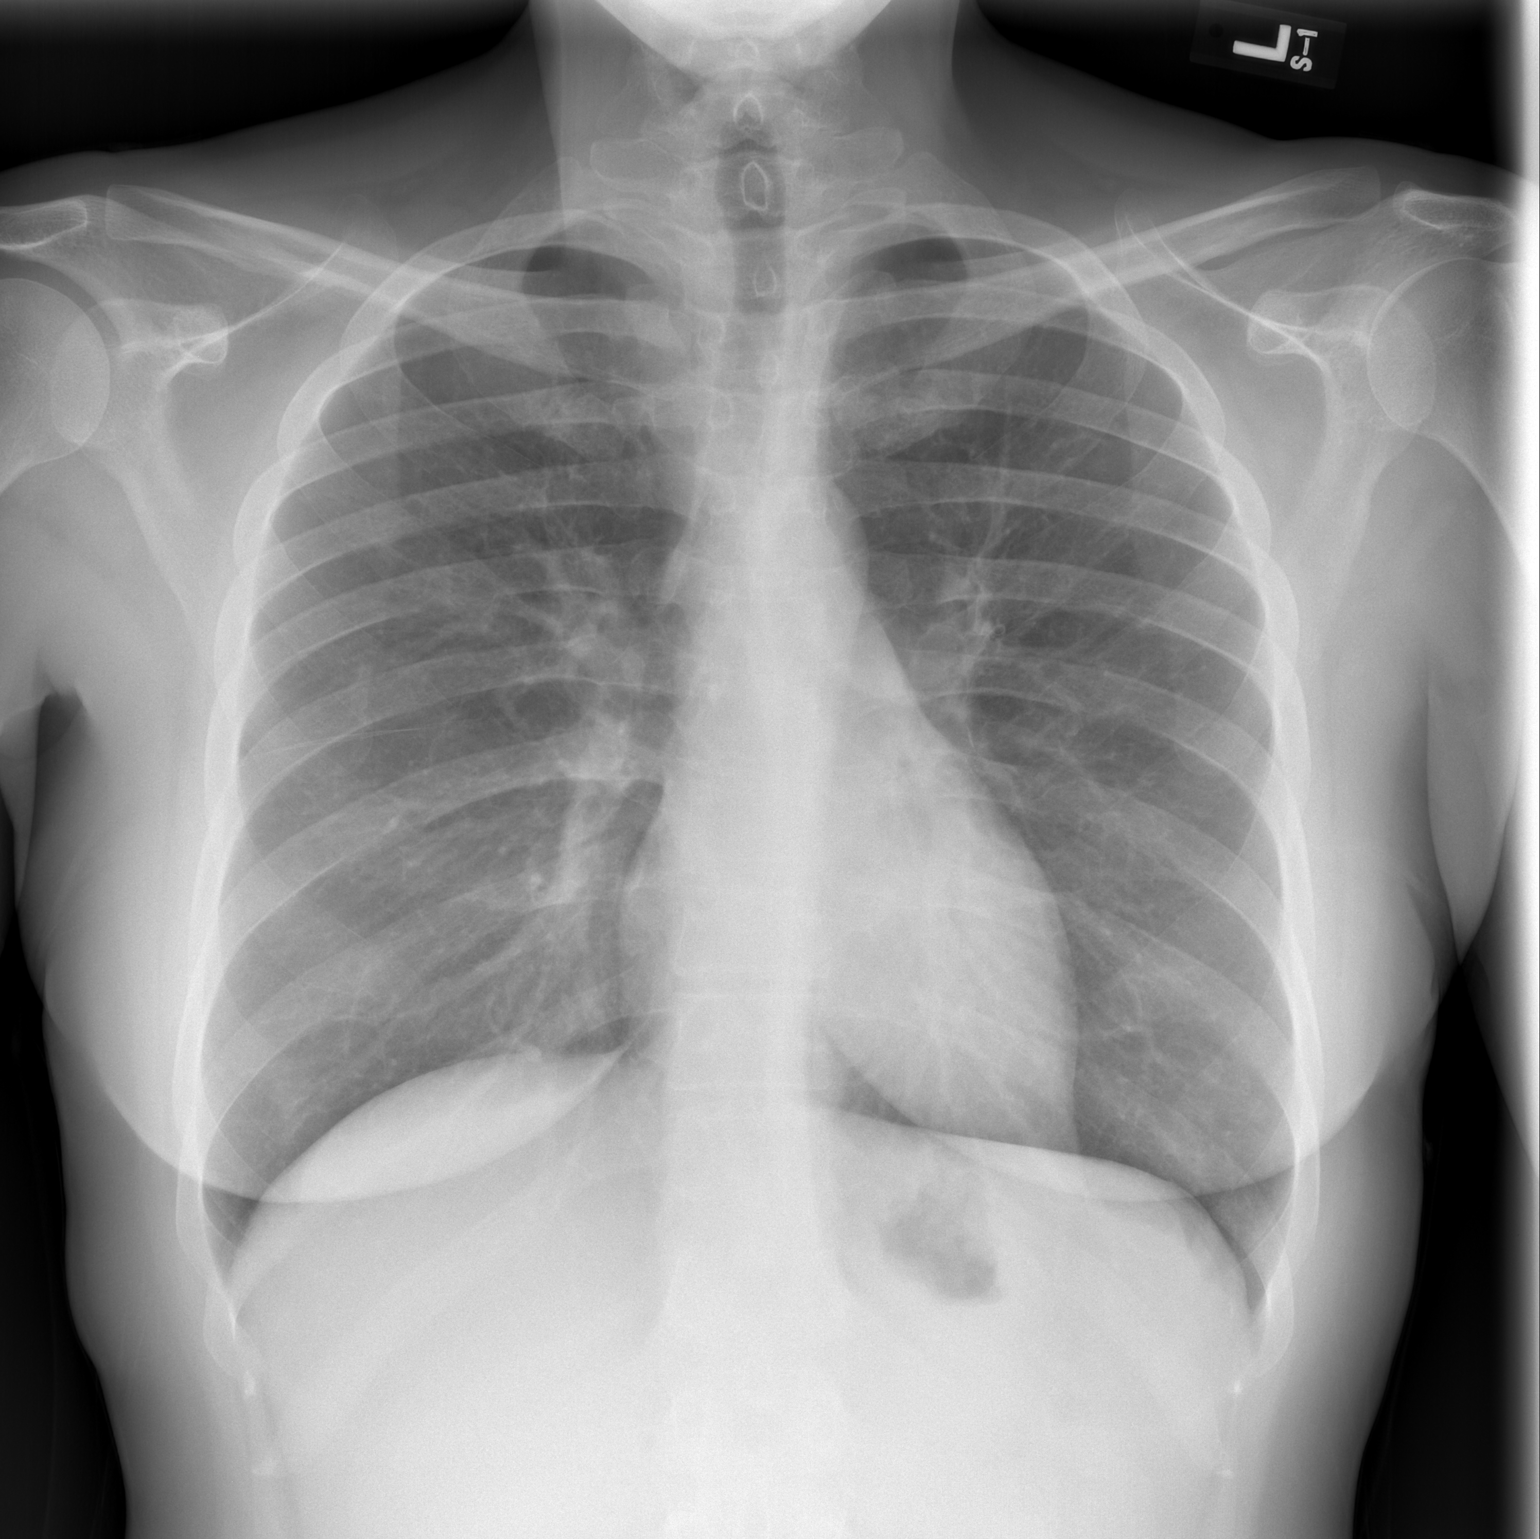

[w chest lat]
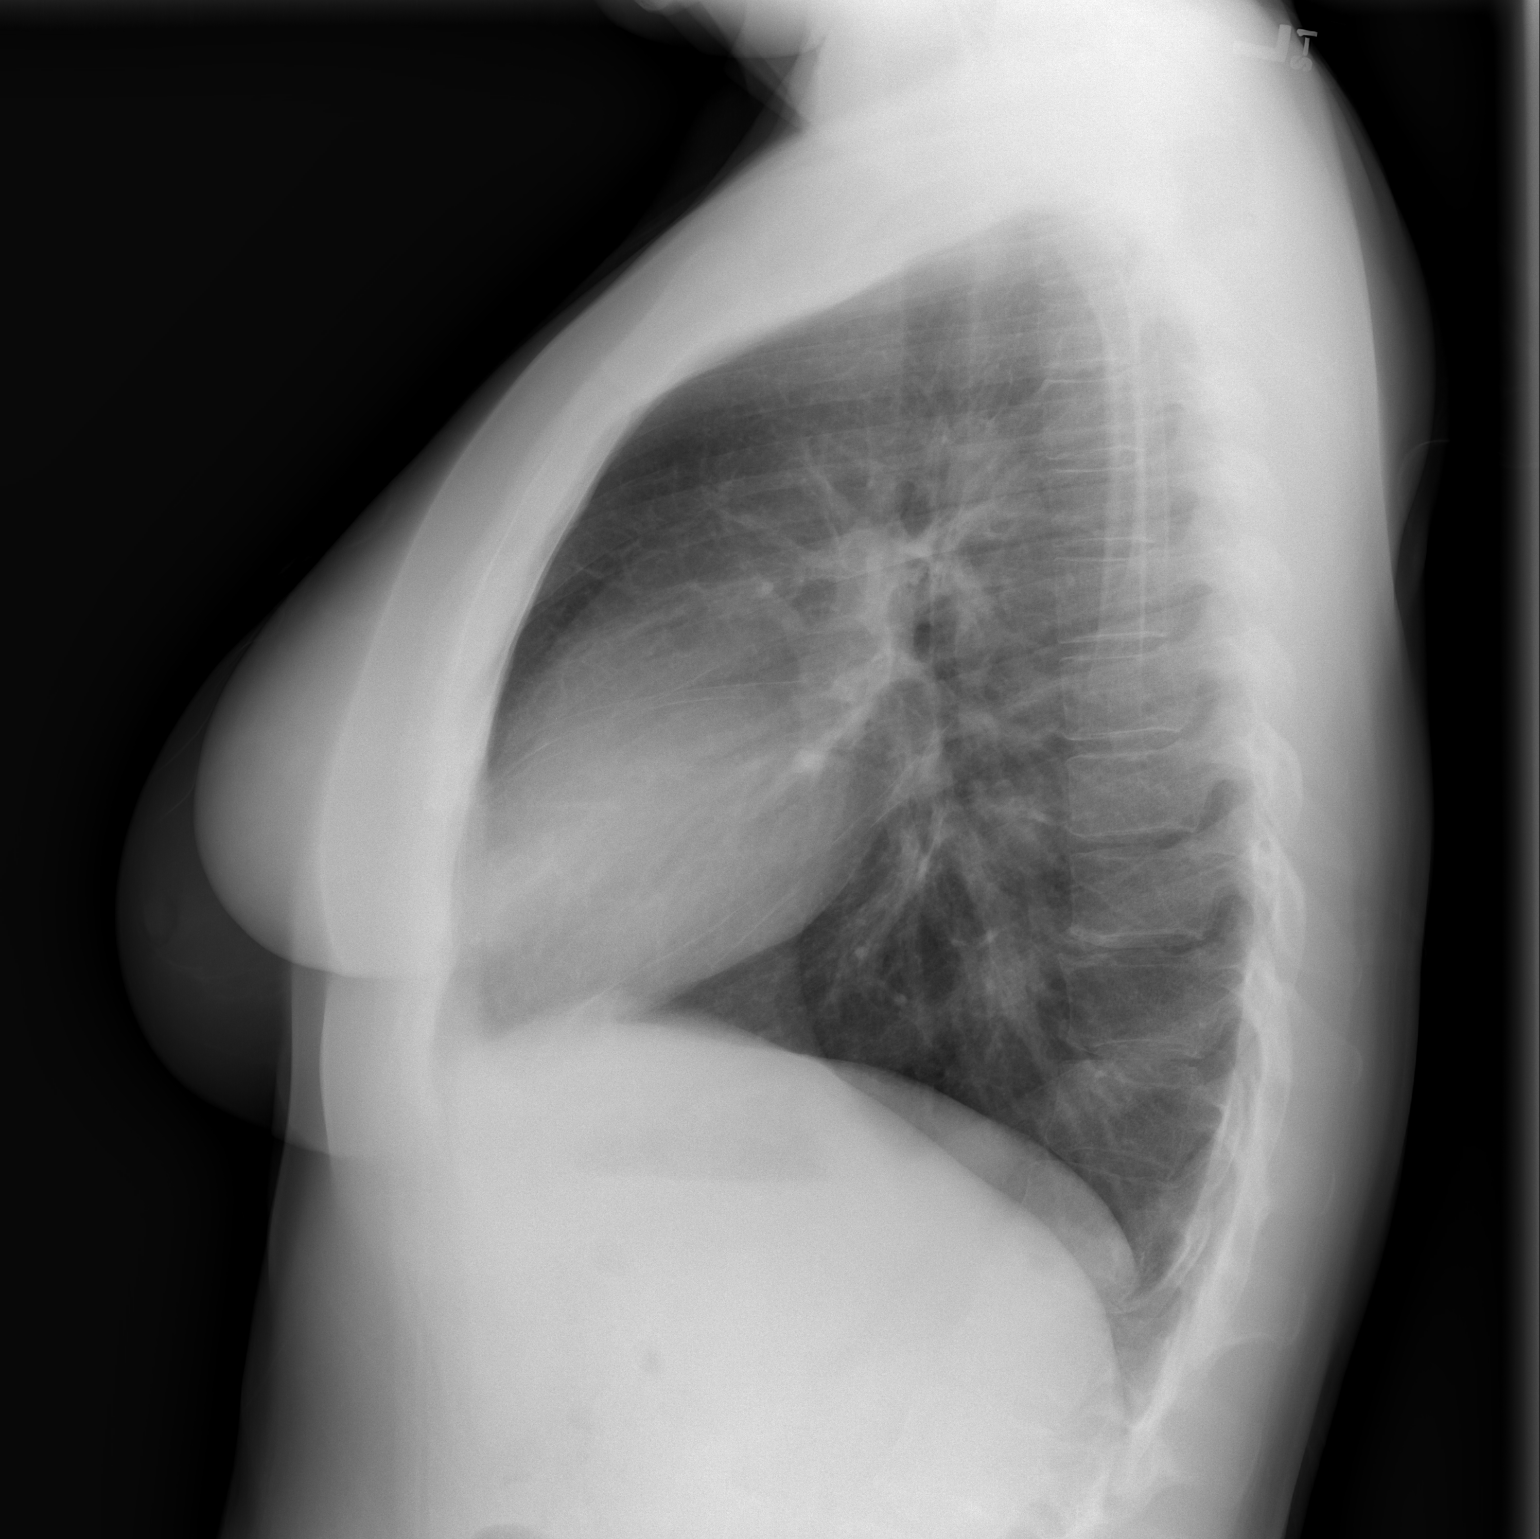

[2 of 2 positions shown; findings below may reference images not displayed]

FINDINGS: Heart and mediastinal contours are within normal limits.
Lung fields are clear with no signs of focal infiltrate or
congestive failure.  No pleural fluid or peribronchial cuffing is
seen.

Bony structures appear intact.
IMPRESSION: No worrisome focal or acute cardiopulmonary abnormality noted.

## 2013-02-02 ENCOUNTER — Ambulatory Visit (INDEPENDENT_AMBULATORY_CARE_PROVIDER_SITE_OTHER): Payer: Self-pay | Admitting: Family Medicine

## 2013-02-02 VITALS — BP 111/75 | HR 92 | Temp 98.4°F | Wt 210.0 lb

## 2013-02-02 DIAGNOSIS — E0500 Thyrotoxicosis with diffuse goiter without thyrotoxic crisis or storm: Secondary | ICD-10-CM

## 2013-02-02 DIAGNOSIS — E669 Obesity, unspecified: Secondary | ICD-10-CM | POA: Insufficient documentation

## 2013-02-02 LAB — TSH: TSH: 44.356 u[IU]/mL — ABNORMAL HIGH (ref 0.350–4.500)

## 2013-02-02 NOTE — Assessment & Plan Note (Signed)
Will draw TSH today and call patient with appropriate thyroid dose. Advised f/u in 3 months.

## 2013-02-02 NOTE — Assessment & Plan Note (Signed)
Discussed importance of proper nutrition for weight loss, discussed very difficult to loose weight on ultra low calorie diets because body goes into survival mode.  Advised that weight loss pills can be dangerous.  Discussed exercise, low carbohydrate diet.

## 2013-02-02 NOTE — Patient Instructions (Signed)
I will call you next week with your lab result and let you know what dose of levothyroxine I am prescribing.   To help you work on weight loss, remember that your body won't loose weight if you are starving yourself.  I want you to eat LOTS of veggies, and lean meats, and eggs.  Avoid foods with carbohydrates and sugar.    Please speak with Katie Obrien to see if you qualify for the [facility] card.

## 2013-02-02 NOTE — Progress Notes (Signed)
  Subjective:    Patient ID: Katie Obrien, female    DOB: 25-May-1976, 37 y.o.   MRN: [id number]  HPI  Katie Obrien comes in for follow up of her thyroid.  She says she moved to [place] for a year, but was not able to see a doctor much, so she has been taking a friend's levothyroxine.  She complains of weight gain, but denies other symptoms of hypothyroidism.    She says she has been unable to lose weight since she started having thyroid issues.  She says she starves herself and cannot loose weight.   Past Medical History  Diagnosis Date  . Migraine   . Hyperthyroidism   . Hypertension   . Grave's disease   . Graves disease    Family History  Problem Relation Age of Onset  . Cancer Maternal Aunt     ovarian  . Cancer Maternal Grandfather     bone   History  Substance Use Topics  . Smoking status: Current Every Day Smoker -- 0.50 packs/day  . Smokeless tobacco: Never Used  . Alcohol Use: Yes     Review of Systems See HPI    Objective:   Physical Exam BP 111/75  Pulse 92  Temp(Src) 98.4 F (36.9 C) (Oral)  Wt 210 lb (95.255 kg)  BMI 30.13 kg/m2 General appearance: alert, cooperative and no distress Neck: No adenopathy or enlarged thryoid.  Throat: lips, mucosa, and tongue normal; teeth and gums normal Lungs: clear to auscultation bilaterally Heart: regular rate and rhythm, S1, S2 normal, no murmur, click, rub or gallop Extremities: extremities normal, atraumatic, no cyanosis or edema       Assessment & Plan:

## 2013-02-05 ENCOUNTER — Telehealth: Payer: Self-pay | Admitting: Family Medicine

## 2013-02-05 DIAGNOSIS — E890 Postprocedural hypothyroidism: Secondary | ICD-10-CM

## 2013-02-05 DIAGNOSIS — E0500 Thyrotoxicosis with diffuse goiter without thyrotoxic crisis or storm: Secondary | ICD-10-CM

## 2013-02-05 MED ORDER — LEVOTHYROXINE SODIUM 175 MCG PO TABS: 175.0000 ug | ORAL_TABLET | Freq: Every day | ORAL | Status: DC

## 2013-02-05 NOTE — Telephone Encounter (Signed)
Called patient, TSH elevated, she has been taking levothryoxine 125 mcg, used to be on 175 mcg.  Rx for 175 sent to pharmacy.  Advised for her to make a lab appointment to have TSH re-checked in 4-6 weeks, future orders put in, she voices understanding.

## 2013-06-20 ENCOUNTER — Ambulatory Visit: Payer: Self-pay | Admitting: Family Medicine

## 2013-06-28 ENCOUNTER — Ambulatory Visit: Payer: Self-pay | Admitting: Family Medicine

## 2013-07-05 ENCOUNTER — Telehealth: Payer: Self-pay | Admitting: Family Medicine

## 2013-07-05 NOTE — Telephone Encounter (Signed)
Spoke with pt.  She states that she thinks she "is having side effects from synthroid".  Pt c/o wt gain (20lbs in last 2months), hair loss, and depression (about wt gain).  Per pt report she has been on synthroid for 2 years since her thyroidectomy.  Pt agreeable to appt tomorrow with PCP. Katie Obrien, Katie Obrien

## 2013-07-05 NOTE — Telephone Encounter (Signed)
Pt is calling to talk to a nurse or doctor about her medication. She is having side effects and would really like to stop taking this. She would like to know how this is going to effect her and what she should do.JW

## 2013-07-06 ENCOUNTER — Ambulatory Visit: Payer: Self-pay | Admitting: Family Medicine

## 2013-07-10 ENCOUNTER — Encounter: Payer: Self-pay | Admitting: Family Medicine

## 2013-07-10 ENCOUNTER — Ambulatory Visit (INDEPENDENT_AMBULATORY_CARE_PROVIDER_SITE_OTHER): Payer: Self-pay | Admitting: Family Medicine

## 2013-07-10 VITALS — BP 109/65 | HR 89 | Temp 98.4°F | Wt 227.0 lb

## 2013-07-10 DIAGNOSIS — Z9114 Patient's other noncompliance with medication regimen: Secondary | ICD-10-CM | POA: Insufficient documentation

## 2013-07-10 DIAGNOSIS — Z91148 Patient's other noncompliance with medication regimen for other reason: Secondary | ICD-10-CM | POA: Insufficient documentation

## 2013-07-10 DIAGNOSIS — Z9119 Patient's noncompliance with other medical treatment and regimen: Secondary | ICD-10-CM

## 2013-07-10 DIAGNOSIS — F329 Major depressive disorder, single episode, unspecified: Secondary | ICD-10-CM

## 2013-07-10 DIAGNOSIS — E890 Postprocedural hypothyroidism: Secondary | ICD-10-CM

## 2013-07-10 NOTE — Progress Notes (Signed)
[facility]  Patient name: Katie Obrien MRN [id number]  Date of birth: 03-May-1976  CC & HPI:  Katie Obrien is a 37 y.o. female presenting today for "thyroid problems." Complains today of several months of wt gain, depression, thinning hair, menstrual irregularities, and Fatigue. Reports taking her Synthroid consistently for the last month, but admits to noncompliance for medication prior to this month (Was is [place] "partying", taking her medication sporidially and friends thyroid medication occasionally). Admits to being told that her thyroid hormone was low at her last visit (01/2013), but failed to follow-up after that visit. Admits to being very depressed today. Wants to go and just drink, but refuses to answer questions on SI/SA saying "I'm too smart to answer that".  Reports that she has not been taking her Effexor (previously prescribed).    ROS:  Please See HPI above   Pertinent History Reviewed:  Medical & Surgical Hx:  Reviewed:  Medications & Allergies: Reviewed & Updated - see associated section  Social History: Reviewed:   reports that she has been smoking.  She has never used smokeless tobacco.  History  Alcohol Use  . Yes   History  Drug Use No    Objective Findings:  Vitals: BP 109/65  Pulse 89  Temp(Src) 98.4 F (36.9 C) (Oral)  Wt 227 lb (102.967 kg)  BMI 32.57 kg/m2  LMP 06/19/2013  Gen: Obese, NAD Head: Normocephalic/Atraumatic Eyes:Sclera white; Conjunctiva pink; PERRLA; EOMI; Visual Fields full by confrontation; Nose: Mucosa pink; No sinus tenderness Throat: Oral mucosa pink, Pharynx w/o exudates; No masses/nodules palpated.  CV: RRR w/o m/r/g, pulses +2 b/l Resp: CTAB w/ normal respiratory effort Lower Ext: No skin changes; No edema; distal pulses intact; Calves nontender   Assessment & Plan:   Please See Problem Focused Assessment & Plan

## 2013-07-10 NOTE — Assessment & Plan Note (Signed)
PHQ -9 = 20 - Single mother with two kids - Admits to "partying" in the past and excessive alcohol use, but denies alcohol use in last month - Previous prescription for Effexor, but not taking  - Refused to answer question about SI/SA: Says "I'm too smart to answer that" - Refusing to talk with social worker today, or referral to Psych (Dr. Pascal Lux)

## 2013-07-10 NOTE — Patient Instructions (Signed)
It was great seeing you today. Below is a list of the things we talked about:   1) Take your Synthroid every morning on empty stomach, 1 hr prior to eating  2) We will check your thyroid hormone today and again in one month at your next visit  Please check-out at the front desk before leaving the clinic.  I look forward to talking with you again at our next visit. If you have any questions or concerns before then, please call the clinic at [phone].  See you soon,   Dr Wenda Low

## 2013-07-10 NOTE — Assessment & Plan Note (Addendum)
Complains of Fatigue, Depression (PHQ9 = 20), Menorrhagia, Hair loss, Wt gain - Admits to not taking Synthroid inconsistently several months ago we she was in [place] (partying), but reports being complaint over last month - Taking Synthroid in morning with Red bull and multi-vitamins  - Wanted to stop Synthroid due to above symptoms, but counseled on these symptoms being signs of hypothyroidism not medication  Plan - Check TSH today - Take synthroid every morning on empty stomach w/o red bull and multivitamins or eating for 1 hr - Recheck TSH in 1 month at next visit and adjust as need - Counseled pt on depression and alcohol use (denies alcohol use in last month): Pt refused medication or Psych or Social work assistance at this time

## 2013-08-10 ENCOUNTER — Ambulatory Visit: Payer: Self-pay | Admitting: Family Medicine

## 2013-08-31 ENCOUNTER — Ambulatory Visit: Payer: Self-pay | Admitting: Family Medicine

## 2013-09-03 ENCOUNTER — Other Ambulatory Visit: Payer: Self-pay | Admitting: Family Medicine

## 2013-09-03 ENCOUNTER — Ambulatory Visit (INDEPENDENT_AMBULATORY_CARE_PROVIDER_SITE_OTHER): Payer: Self-pay | Admitting: Family Medicine

## 2013-09-03 ENCOUNTER — Encounter: Payer: Self-pay | Admitting: Family Medicine

## 2013-09-03 VITALS — BP 124/75 | HR 80 | Ht 70.0 in | Wt 223.0 lb

## 2013-09-03 DIAGNOSIS — E890 Postprocedural hypothyroidism: Secondary | ICD-10-CM

## 2013-09-03 DIAGNOSIS — F329 Major depressive disorder, single episode, unspecified: Secondary | ICD-10-CM

## 2013-09-03 DIAGNOSIS — F3289 Other specified depressive episodes: Secondary | ICD-10-CM

## 2013-09-03 LAB — BASIC METABOLIC PANEL
BUN: 14 mg/dL (ref 6–23)
CO2: 26 mEq/L (ref 19–32)
Calcium: 9.2 mg/dL (ref 8.4–10.5)
Chloride: 104 mEq/L (ref 96–112)
Creat: 0.8 mg/dL (ref 0.50–1.10)
Potassium: 4.9 mEq/L (ref 3.5–5.3)

## 2013-09-03 NOTE — Progress Notes (Signed)
  Patient name: Katie Obrien MRN [id number]  Date of birth: 1976-06-14  CC & HPI:  Katie Obrien is a 37 y.o. female presenting today for follow-up for hypothyroidism. She report compliance with her synthroid since her previous visit. Waking up at 3am to take synthroid on empty stomach so that she can have Red bull and food when she wakes up for work. She reports little improvement in her previous symptoms: fatigue, depression, sleep difficulties, dry mouth, "lump in throat", and difficulty losing weight. She continues to endorse symptoms of depression, denies SI, and is reluctant to start "mood" medications or see a counselor "I'm a single mom with two kids, and don't have time for that right now." She would be interested in depression medications if weight gain was not a potential side-effect.     ROS: See HPI above otherwise negative.  Medical & Surgical Hx:  Reviewed.  Medications & Allergies: Reviewed & Updated - see associated section Social History: Reviewed:   Objective Findings:  Vitals: BP 124/75  Pulse 80  Ht 5\' 10"  (1.778 m)  Wt 223 lb (101.152 kg)  BMI 32.00 kg/m2  Gen: NAD CV: RRR w/o m/r/g, pulses +2 b/l Resp: CTAB w/ normal respiratory effort   Assessment & Plan:   Please See Problem Focused Assessment & Plan

## 2013-09-03 NOTE — Patient Instructions (Addendum)
It was great seeing you today.   1. I will recheck your Thyroid level today and call you with the results. When I call we will discuss adjusting you thyroid medication or starting a new medication to help with Mood, Sleep, Energy and Weight loss   Please bring all your medications to every doctors visit  Sign up for My Chart to have easy access to your labs results, and communication with your Primary care physician.   Please check-out at the front desk before leaving the clinic.   I look forward to talking with you again at our next visit. If you have any questions or concerns before then, please call the clinic at [phone].  Take Care,   Dr Wenda Low

## 2013-09-03 NOTE — Assessment & Plan Note (Addendum)
-   Recheck TSH today and Adjust synthroid as needed - She report taking an extra pill 2 times in the last month "when she can't wait before eating so that she absorbs enough" - BMET to assess Calcium - F/u visit in 2months

## 2013-09-03 NOTE — Assessment & Plan Note (Signed)
Symptoms minimally improved; Denies current SI - Check CBC and Vit D - Consider starting medications depending on TSH and adequate treatment of Hypothyroidism - Possible Topamax or SNRI - Still unwilling to speak with counselor or Psychiatrist

## 2013-09-04 LAB — CBC
Hemoglobin: 8.9 g/dL — ABNORMAL LOW (ref 12.0–15.0)
MCH: 19.2 pg — ABNORMAL LOW (ref 26.0–34.0)
MCV: 65.3 fL — ABNORMAL LOW (ref 78.0–100.0)
Platelets: 676 10*3/uL — ABNORMAL HIGH (ref 150–400)
RBC: 4.64 MIL/uL (ref 3.87–5.11)
WBC: 11 10*3/uL — ABNORMAL HIGH (ref 4.0–10.5)

## 2013-09-04 LAB — IRON AND TIBC
Iron: 10 ug/dL — ABNORMAL LOW (ref 42–145)
TIBC: 388 ug/dL (ref 250–470)
UIBC: 378 ug/dL (ref 125–400)

## 2013-09-04 LAB — TSH: TSH: 1.969 u[IU]/mL (ref 0.350–4.500)

## 2013-09-04 LAB — RETICULOCYTES
ABS Retic: 87 10*3/uL (ref 19.0–186.0)
RBC.: 4.58 MIL/uL (ref 3.87–5.11)
Retic Ct Pct: 1.9 % (ref 0.4–2.3)

## 2013-09-04 LAB — FOLATE: Folate: 4.2 ng/mL

## 2013-09-04 LAB — FERRITIN: Ferritin: 2 ng/mL — ABNORMAL LOW (ref 10–291)

## 2013-09-04 LAB — VITAMIN B12: Vitamin B-12: 1185 pg/mL — ABNORMAL HIGH (ref 211–911)

## 2013-09-05 LAB — PATHOLOGIST SMEAR REVIEW

## 2013-09-06 ENCOUNTER — Other Ambulatory Visit: Payer: Self-pay | Admitting: Family Medicine

## 2013-09-06 ENCOUNTER — Telehealth: Payer: Self-pay | Admitting: Family Medicine

## 2013-09-06 MED ORDER — TOPIRAMATE 50 MG PO TABS: 25.0000 mg | ORAL_TABLET | Freq: Every day | ORAL | Status: DC

## 2013-09-06 NOTE — Telephone Encounter (Signed)
Patient awaiting call from Dr. Gayla Doss about results and meds.

## 2013-09-06 NOTE — Progress Notes (Signed)
Called patient to discuss lab results and HA. Informed her that she has iron deficiency anemia. She does report menorrhagia that has been discussed at previous visit. Will treat hypothyroidism, start iron supplementation, and continue to monitor. Also discussed recurrent migraines. Will start Topamax for migraine prophylaxis and mood stabilization. Discuss referral to Behavioral medicine clinic at next visit.

## 2013-09-14 ENCOUNTER — Other Ambulatory Visit: Payer: Self-pay | Admitting: Family Medicine

## 2013-09-14 ENCOUNTER — Telehealth: Payer: Self-pay | Admitting: Family Medicine

## 2013-09-14 MED ORDER — TOPIRAMATE 50 MG PO TABS: 25.0000 mg | ORAL_TABLET | Freq: Every day | ORAL | Status: DC

## 2013-09-14 NOTE — Telephone Encounter (Signed)
Pt is requesting a refill on her Topamax. She said that [facility] does not have it and I didn't see it up front. jw

## 2013-10-16 ENCOUNTER — Other Ambulatory Visit: Payer: Self-pay | Admitting: Family Medicine

## 2013-10-22 ENCOUNTER — Ambulatory Visit: Payer: Self-pay | Admitting: Family Medicine

## 2013-10-29 ENCOUNTER — Ambulatory Visit: Payer: Self-pay | Admitting: Family Medicine

## 2014-01-08 ENCOUNTER — Other Ambulatory Visit (HOSPITAL_COMMUNITY)
Admission: RE | Admit: 2014-01-08 | Discharge: 2014-01-08 | Disposition: A | Discharge status: Home / Self Care | Payer: Medicaid Other | Source: Ambulatory Visit | Attending: Family Medicine | Admitting: Family Medicine

## 2014-01-08 ENCOUNTER — Encounter: Payer: Self-pay | Admitting: Family Medicine

## 2014-01-08 ENCOUNTER — Other Ambulatory Visit: Payer: Self-pay | Admitting: Family Medicine

## 2014-01-08 ENCOUNTER — Ambulatory Visit (INDEPENDENT_AMBULATORY_CARE_PROVIDER_SITE_OTHER): Payer: Self-pay | Admitting: Family Medicine

## 2014-01-08 VITALS — BP 118/84 | HR 73 | Temp 98.6°F | Wt 223.0 lb

## 2014-01-08 DIAGNOSIS — D649 Anemia, unspecified: Secondary | ICD-10-CM

## 2014-01-08 DIAGNOSIS — N644 Mastodynia: Secondary | ICD-10-CM

## 2014-01-08 DIAGNOSIS — Z01419 Encounter for gynecological examination (general) (routine) without abnormal findings: Secondary | ICD-10-CM | POA: Insufficient documentation

## 2014-01-08 DIAGNOSIS — G43901 Migraine, unspecified, not intractable, with status migrainosus: Secondary | ICD-10-CM

## 2014-01-08 DIAGNOSIS — E890 Postprocedural hypothyroidism: Secondary | ICD-10-CM

## 2014-01-08 DIAGNOSIS — Z124 Encounter for screening for malignant neoplasm of cervix: Secondary | ICD-10-CM

## 2014-01-08 HISTORY — DX: Migraine, unspecified, not intractable, with status migrainosus: G43.901

## 2014-01-08 HISTORY — DX: Anemia, unspecified: D64.9

## 2014-01-08 MED ORDER — FERROUS SULFATE 325 (65 FE) MG PO TABS: 325.0000 mg | ORAL_TABLET | Freq: Every day | ORAL | Status: DC

## 2014-01-08 NOTE — Progress Notes (Signed)
  Patient name: Katie Obrien MRN [id number]  Date of birth: May 06, 1976  CC & HPI:  Katie Obrien is a 38 y.o. female presenting today for Pap smear and breast pain.   Breast pain - cramping breast pain under her left breast areola for the past 3 months; she reports this is similar to this pain she has had with previous menstrual cycle, but is concerned that is not going away - She denies any nipple discharge, changes in overlying skin, adenopathy, or weight loss - he reports night sweats, but reports this is been present since her thyroid problems - she reports cutting back on her caffeine intake significantly, but is still drinking 3 or more cups of coffee a day - She reports no breast cancer in first degree relative, but says she has 2 aunts who had breast cancer in her 2740s  Hypothyroid - She reports missing a couple doses of her thyroid medication in the past month, him and says she sometimes has to take her medication with her drink/food - still reports fatigue and dissatisfaction with no weight loss  Anemia - continues to report heavy periods, lasting for approximately 10 days - Not want to be on birth control pills - As her tubes tied - Started taking multivitamin with iron  Migraines - Never started taking Topamax - continues to get migraines, which she treats with ibuprofen  ROS: See HPI above otherwise negative.  Medical & Surgical Hx:  Reviewed.  Medications & Allergies: Reviewed & Updated - see associated section Social History: Reviewed:   Objective Findings:  Vitals: BP 118/84  Pulse 73  Temp(Src) 98.6 F (37 C) (Oral)  Wt 223 lb (101.152 kg)  LMP 12/08/2013  Gen: NAD HEENT: no thyromegaly, no cervical adenopathy Breast: No nodules palpated in either breast; focal tenderness under left areola ~4cm in diameter, no axillary adenopathy; no skin erythema or dimpling, no nipple discharge CV: RRR w/o m/r/g, pulses +2 b/l Resp: CTAB w/ normal  respiratory effort Speculum Exam: Ext genitalia: wnl; Vaginal discharge: minimal; Cervix: wnl; Pap smear collected Bimanual Exam: No Cervical motion tenderness; No Vaginal wall defects; Adnexa nontender   Assessment & Plan:   Please See Problem Focused Assessment & Plan

## 2014-01-08 NOTE — Assessment & Plan Note (Signed)
-   never started taking Topamax - Continues to treat headaches with ibuprofen

## 2014-01-08 NOTE — Assessment & Plan Note (Signed)
Pertinent S&O  Focal breast pain under left areola without palpable nodule x 3 months  Reports 2 aunts with breast cancer in their 40s Assessment/Plan  mammogram

## 2014-01-08 NOTE — Assessment & Plan Note (Signed)
Recheck TSH 

## 2014-01-08 NOTE — Patient Instructions (Signed)
It was great seeing you today.   1. I will recheck you thyroid level and call you if it is abnormal 2. I have ordered a mammogram to further assess your breast pain 3. I will review the results of your pap smear and let you know when you need another one   Please bring all your medications to every doctors visit  Sign up for My Chart to have easy access to your labs results, and communication with your Primary care physician.  Next Appointment  Please call to make an appointment with Dr Gayla DossJoyner as needed  I look forward to talking with you again at our next visit. If you have any questions or concerns before then, please call the clinic at [phone].  Take Care,   Dr Wenda LowJames Dejanae Helser

## 2014-01-08 NOTE — Assessment & Plan Note (Signed)
Pertinent S&O  Is likely secondary to her menorrhagia  Likely complicated by her noncompliance with Synthroid Assessment/Plan  Advise continue multivitamin, and taking additional iron supplementation  Discussed possibility of Mirena IUD for menorrhagia, and she does not want to take birth control pills  We'll continue to get hyperthyroidism therapeutic

## 2014-01-09 LAB — TSH: TSH: 121.493 u[IU]/mL — ABNORMAL HIGH (ref 0.350–4.500)

## 2014-01-10 ENCOUNTER — Telehealth: Payer: Self-pay | Admitting: Family Medicine

## 2014-01-10 DIAGNOSIS — E039 Hypothyroidism, unspecified: Secondary | ICD-10-CM

## 2014-01-10 DIAGNOSIS — D649 Anemia, unspecified: Secondary | ICD-10-CM

## 2014-01-12 NOTE — Telephone Encounter (Signed)
Called patient to tell her TSH was high again. She said she thought this would happen because she had missed several doses, and had been taking her synthroid with meals several times. No adjustment to dose was made as she had normal TSH 4 months ago when she was taking her synthroid correctly. She will return to clinic for lab apt in 2 months for CBC and TSH.

## 2014-01-30 ENCOUNTER — Encounter: Payer: Self-pay | Admitting: Family Medicine

## 2014-01-30 ENCOUNTER — Ambulatory Visit (INDEPENDENT_AMBULATORY_CARE_PROVIDER_SITE_OTHER): Payer: Medicaid Other | Admitting: Family Medicine

## 2014-01-30 VITALS — BP 126/78 | HR 99 | Temp 98.3°F | Ht 70.0 in | Wt 219.0 lb

## 2014-01-30 DIAGNOSIS — Z309 Encounter for contraceptive management, unspecified: Secondary | ICD-10-CM

## 2014-01-30 LAB — POCT URINE PREGNANCY: PREG TEST UR: NEGATIVE

## 2014-01-30 MED ORDER — LEVONORGESTREL 20 MCG/24HR IU IUD
INTRAUTERINE_SYSTEM | Freq: Once | INTRAUTERINE | Status: AC
  Administered 2014-01-30: 1 via INTRAUTERINE

## 2014-01-30 NOTE — Progress Notes (Signed)
Patient ID: Katie Obrien, female   DOB: 12/29/1975, 38 y.o.   MRN: [id number] IUD Insertion Procedure Note  Pre-operative Diagnosis: heavy menstrual bleeding  Post-operative Diagnosis: same  Indications: Menorrhagia   Procedure Details  Urine pregnancy test was done  and result was negative.  The risks (including infection, bleeding, pain, and uterine perforation) and benefits of the procedure were explained to the patient and Written informed consent was obtained.    Cervix cleansed with Betadine. Uterus sounded to 8 cm. IUD inserted without difficulty. String visible and trimmed. Patient tolerated procedure well.  IUD Information: Mirena.  Condition: Stable  Complications: None  Plan:  The patient was advised to call for any fever or for prolonged or severe pain or bleeding. She was advised to use NSAID as needed for mild to moderate pain.   Attending Physician Documentation: I was present for or participated in the entire procedure.   Dessa PhiFunches, Irvin Lizama MD

## 2014-01-30 NOTE — Patient Instructions (Signed)
It was great seeing you today.   1. You should come back in 1 week for check of your IUD  Intrauterine Device Insertion, Care After Refer to this sheet in the next few weeks. These instructions provide you with information on caring for yourself after your procedure. Your health care provider may also give you more specific instructions. Your treatment has been planned according to current medical practices, but problems sometimes occur. Call your health care provider if you have any problems or questions after your procedure. WHAT TO EXPECT AFTER THE PROCEDURE Insertion of the IUD may cause some discomfort, such as cramping. The cramping should improve after the IUD is in place. You may have bleeding after the procedure. This is normal. It varies from light spotting for a few days to menstrual-like bleeding. When the IUD is in place, a string will extend past the cervix into the vagina for 1 2 inches. The strings should not bother you or your partner. If they do, talk to your health care provider.  HOME CARE INSTRUCTIONS   Check your intrauterine device (IUD) to make sure it is in place before you resume sexual activity. You should be able to feel the strings. If you cannot feel the strings, something may be wrong. The IUD may have fallen out of the uterus, or the uterus may have been punctured (perforated) during placement. Also, if the strings are getting longer, it may mean that the IUD is being forced out of the uterus. You no longer have full protection from pregnancy if any of these problems occur.  You may resume sexual intercourse if you are not having problems with the IUD. The copper IUD is considered immediately effective, and the hormone IUD works right away if inserted within 7 days of your period starting. You will need to use a backup method of birth control for 7 days if the IUD in inserted at any other time in your cycle.  Continue to check that the IUD is still in place by feeling  for the strings after every menstrual period.  You may need to take pain medicine such as acetaminophen or ibuprofen. Only take medicines as directed by your health care provider. SEEK MEDICAL CARE IF:   You have bleeding that is heavier or lasts longer than a normal menstrual cycle.  You have a fever.  You have increasing cramps or abdominal pain not relieved with medicine.  You have abdominal pain that does not seem to be related to the same area of earlier cramping and pain.  You are lightheaded, unusually weak, or faint.  You have abnormal vaginal discharge or smells.  You have pain during sexual intercourse.  You cannot feel the IUD strings, or the IUD string has gotten longer.  You feel the IUD at the opening of the cervix in the vagina.  You think you are pregnant, or you miss your menstrual period.  The IUD string is hurting your sex partner. MAKE SURE YOU:  Understand these instructions.  Will watch your condition.  Will get help right away if you are not doing well or get worse. Document Released: 05/12/2011 Document Revised: 07/04/2013 Document Reviewed: 03/04/2013 [facility] Patient Information 2014 [employer] [employer] [place].  Next Appointment  Please call to make an appointment with Dr Gayla DossJoyner in 1 week   I look forward to talking with you again at our next visit. If you have any questions or concerns before then, please call the clinic at [phone].  Take Care,  Dr Wenda LowJames Lashona Schaaf

## 2014-01-31 NOTE — Progress Notes (Signed)
   Subjective:    Patient ID: Katie Obrien, female    DOB: [birth year], 38 y.o.   MRN: [id number]  HPI Comments: She comes in today for heavy menstrual bleeding and wanting IUD. She reports HMB since the onset of menses (age 38) with similar HMB noted in mother. She reports menses that last 10-12 days, requiring up to 10 pads/tampons a day, and requiring her to stay home on the first day for fear of bleeding through several outfits if she goes out. She reports never trying OCPs because she doesn't want to "gain weight". She has had two pregnancies without complication other than c/s for failure to progress. She says Midol limits her bleeding to 10 days. She denies any Fhx of clotting disorders and denies any heavy bleeding during previous surgeries  (c/s, thyroidectomy). She has her tubes tied, but would like an IUD to help with menstrual bleeding. She denies any hx of blood clots but continues to smoke 1 ppd. .      Review of Systems  Constitutional: Positive for fatigue. Negative for fever.  Genitourinary: Negative for vaginal discharge, vaginal pain and pelvic pain.      Objective:   Physical Exam  Constitutional: She appears well-developed and well-nourished.  Cardiovascular: Normal rate and regular rhythm.   Pulmonary/Chest: Effort normal and breath sounds normal.   Assessment/Plan:      See Problem Focused Assessment & Plan

## 2014-02-07 ENCOUNTER — Encounter: Payer: Self-pay | Admitting: Family Medicine

## 2014-02-07 ENCOUNTER — Ambulatory Visit (INDEPENDENT_AMBULATORY_CARE_PROVIDER_SITE_OTHER): Payer: Self-pay | Admitting: Family Medicine

## 2014-02-07 VITALS — BP 155/87 | HR 90 | Temp 98.5°F | Wt 223.0 lb

## 2014-02-07 DIAGNOSIS — Z30431 Encounter for routine checking of intrauterine contraceptive device: Secondary | ICD-10-CM | POA: Insufficient documentation

## 2014-02-07 NOTE — Patient Instructions (Signed)
Everything looks great today. Let us know if you have any problems!  Amber M. Hairford, M.D.

## 2014-02-07 NOTE — Assessment & Plan Note (Signed)
String check normal. F/u if develops any concerns.

## 2014-02-07 NOTE — Progress Notes (Signed)
Patient ID: Waynetta Peanrystal G FritzDoughty, female   DOB: 02-Dec-1975, 38 y.o.   MRN: [id number]    Subjective: HPI: Patient is a 38 y.o. female presenting to clinic today for IUD check. Concerns today include none  IUD placed last week. Had initial cramping and a few days of spotting. Since then she has felt well. No concerns.   History Reviewed: Daily smoker.  ROS: Please see HPI above.  Objective: Office vital signs reviewed. BP 155/87  Pulse 90  Temp(Src) 98.5 F (36.9 C) (Oral)  Wt 223 lb (101.152 kg)  LMP 01/16/2014  Physical Examination:  General: Awake, alert. NAD HEENT: Atraumatic, normocephalic Abd: Soft, nontender GU: Normal external genitalia. Cervix wnl, Strings visualized and felt on bimamual exam. Thin white discharge noted in vagina. Neuro: Strength and sensation grossly intact  Assessment: 38 y.o. female IUD check  Plan: See Problem List and After Visit Summary

## 2014-02-13 ENCOUNTER — Ambulatory Visit (INDEPENDENT_AMBULATORY_CARE_PROVIDER_SITE_OTHER): Payer: Self-pay | Admitting: Family Medicine

## 2014-02-13 ENCOUNTER — Encounter: Payer: Self-pay | Admitting: Family Medicine

## 2014-02-13 VITALS — BP 124/69 | HR 76 | Temp 99.5°F | Ht 70.0 in | Wt 220.0 lb

## 2014-02-13 DIAGNOSIS — N898 Other specified noninflammatory disorders of vagina: Secondary | ICD-10-CM

## 2014-02-13 DIAGNOSIS — Z30431 Encounter for routine checking of intrauterine contraceptive device: Secondary | ICD-10-CM

## 2014-02-13 DIAGNOSIS — N939 Abnormal uterine and vaginal bleeding, unspecified: Secondary | ICD-10-CM

## 2014-02-13 LAB — POCT URINE PREGNANCY: Preg Test, Ur: NEGATIVE

## 2014-02-13 NOTE — Patient Instructions (Addendum)
You can take Ibuprofen as needed if pain or cramps develop.  Follow up with your primary doctor to discuss further therapy.

## 2014-02-13 NOTE — Progress Notes (Signed)
Family Medicine Office Visit Note   Subjective:   Patient ID: Katie Obrien, female  DOB: 12/11/1975, 38 y.o.. MRN: [id number]   Pt that comes today complaining of vaginal bleeding. She had IUD Mirena inserted 3 weeks ago. Since then pt reports intermittent vaginal bleeding that has gotten worse in the past few days with the presence of clots and cramping. IUD was placed due to menorrhagia, but she wants us to retrieve it. She reports reading on the web about it and desires discontinuing it. She has tubal ligation for birth control.   Review of Systems:  Per HPI   Objective:   Physical Exam: Gen:  NAD HEENT: Moist mucous membranes  CV: Regular rate and rhythm, no murmurs rubs or gallops PULM: Clear to auscultation bilaterally. No wheezes/rales/rhonchi ABD: Soft, non tender, non distended, normal bowel sounds EXT: No edema Neuro: Alert and oriented x3. No focalization Vulva and perianal area: Normal  Speculum: Vagina and cervix of normal appearance, no friability, no discharge. Menstrual bleeding from OS  Bimanual exam: Uterus anteverted, no adnexal masses. No cervical motion tenderness.  Assessment & Plan:    PRE-OP DIAGNOSIS: vaginal bleeding/cramps  PROCEDURE: IUD removal  PROCEDURE:  The speculum was placed and the IUD string visualized. Using ringed forceps, the IUD string was grasped and the device was removed without difficulty. Bleeding was unchanged.  Followup: The patient tolerated the procedure well without complications. Standard post-procedure care is explained and return precautions are given

## 2014-02-14 ENCOUNTER — Ambulatory Visit: Payer: Medicaid Other | Admitting: Family Medicine

## 2014-02-14 NOTE — Assessment & Plan Note (Signed)
Pregnancy test is negative IUD Retrieved today. More detail about procedure is under the progress note.

## 2014-02-19 ENCOUNTER — Other Ambulatory Visit: Payer: Self-pay | Admitting: Family Medicine

## 2014-05-28 ENCOUNTER — Ambulatory Visit: Payer: Medicaid Other | Admitting: Family Medicine

## 2014-06-10 ENCOUNTER — Other Ambulatory Visit: Payer: Self-pay | Admitting: Family Medicine

## 2014-07-01 ENCOUNTER — Other Ambulatory Visit: Payer: Self-pay | Admitting: *Deleted

## 2014-07-03 ENCOUNTER — Encounter (HOSPITAL_COMMUNITY): Payer: Self-pay | Admitting: Emergency Medicine

## 2014-07-03 ENCOUNTER — Emergency Department (HOSPITAL_COMMUNITY): Payer: Medicaid Other

## 2014-07-03 ENCOUNTER — Emergency Department (HOSPITAL_COMMUNITY)
Admission: EM | Admit: 2014-07-03 | Discharge: 2014-07-03 | Disposition: A | Discharge status: Home / Self Care | Payer: Medicaid Other | Attending: Emergency Medicine | Admitting: Emergency Medicine

## 2014-07-03 DIAGNOSIS — R079 Chest pain, unspecified: Secondary | ICD-10-CM | POA: Insufficient documentation

## 2014-07-03 DIAGNOSIS — Z3202 Encounter for pregnancy test, result negative: Secondary | ICD-10-CM | POA: Insufficient documentation

## 2014-07-03 DIAGNOSIS — I10 Essential (primary) hypertension: Secondary | ICD-10-CM | POA: Insufficient documentation

## 2014-07-03 DIAGNOSIS — Z720 Tobacco use: Secondary | ICD-10-CM | POA: Diagnosis not present

## 2014-07-03 DIAGNOSIS — Z880 Allergy status to penicillin: Secondary | ICD-10-CM | POA: Diagnosis not present

## 2014-07-03 DIAGNOSIS — Z8639 Personal history of other endocrine, nutritional and metabolic disease: Secondary | ICD-10-CM | POA: Diagnosis not present

## 2014-07-03 DIAGNOSIS — R0602 Shortness of breath: Secondary | ICD-10-CM | POA: Diagnosis present

## 2014-07-03 LAB — CBC
HCT: 30.7 % — ABNORMAL LOW (ref 36.0–46.0)
Hemoglobin: 9.8 g/dL — ABNORMAL LOW (ref 12.0–15.0)
MCH: 22.7 pg — AB (ref 26.0–34.0)
MCHC: 31.9 g/dL (ref 30.0–36.0)
MCV: 71.1 fL — AB (ref 78.0–100.0)
PLATELETS: 513 10*3/uL — AB (ref 150–400)
RBC: 4.32 MIL/uL (ref 3.87–5.11)
RDW: 20 % — ABNORMAL HIGH (ref 11.5–15.5)
WBC: 10.4 10*3/uL (ref 4.0–10.5)

## 2014-07-03 LAB — BASIC METABOLIC PANEL
Anion gap: 14 (ref 5–15)
BUN: 10 mg/dL (ref 6–23)
CALCIUM: 9.4 mg/dL (ref 8.4–10.5)
CO2: 25 meq/L (ref 19–32)
CREATININE: 1.01 mg/dL (ref 0.50–1.10)
Chloride: 96 mEq/L (ref 96–112)
GFR calc Af Amer: 82 mL/min — ABNORMAL LOW (ref >90)
GFR calc non Af Amer: 71 mL/min — ABNORMAL LOW (ref >90)
Glucose, Bld: 79 mg/dL (ref 70–99)
Potassium: 4.2 mEq/L (ref 3.7–5.3)
Sodium: 135 mEq/L — ABNORMAL LOW (ref 137–147)

## 2014-07-03 LAB — POC URINE PREG, ED: PREG TEST UR: NEGATIVE

## 2014-07-03 LAB — I-STAT TROPONIN, ED: Troponin i, poc: 0 ng/mL (ref 0.00–0.08)

## 2014-07-03 LAB — T4, FREE: FREE T4: 0.35 ng/dL — AB (ref 0.80–1.80)

## 2014-07-03 LAB — TSH: TSH: >100 u[IU]/mL — ABNORMAL HIGH (ref 0.350–4.500)

## 2014-07-03 LAB — D-DIMER, QUANTITATIVE (NOT AT ARMC)

## 2014-07-03 MED ORDER — PREDNISONE 20 MG PO TABS: 60.0000 mg | ORAL_TABLET | Freq: Every day | ORAL | Status: AC

## 2014-07-03 MED ORDER — LEVOTHYROXINE SODIUM 175 MCG PO TABS: ORAL_TABLET | ORAL | Status: DC

## 2014-07-03 MED ORDER — PREDNISONE 20 MG PO TABS
60.0000 mg | ORAL_TABLET | ORAL | Status: DC
  Filled 2014-07-03: qty 3

## 2014-07-03 NOTE — ED Provider Notes (Signed)
CSN: [id number]     Arrival date & time 07/03/14  1551 History   First MD Initiated Contact with Patient 07/03/14 1859     Chief Complaint  Patient presents with  . Shortness of Breath     (Consider location/radiation/quality/duration/timing/severity/associated sxs/prior Treatment) HPI Patient presents with concern of dyspnea, occasional left-sided chest pain, pleuritic. Symptoms have been increasing recently, though the exact time of onset is unclear. The chest pain is focally about the left lower chest wall nonradiating. There is no exertional chest pain. There is no concurrent lightheadedness, syncope, nausea, vomiting, diarrhea. No fevers, chills. Patient has a notable history of prior thyroidectomy, with subsequent initiation of thyroid supplement. Patient stopped taking his medication several months ago secondary to unwanted side effects. Currently she denies listlessness, stupor, confusion, fatigue. Patient smokes, drinks.  I encouraged patient to stop smoking.    Past Medical History  Diagnosis Date  . Migraine   . Hyperthyroidism   . Hypertension   . Grave's disease   . Graves disease    Past Surgical History  Procedure Laterality Date  . Tubal ligation    . Cesarean section  1998, 2001  . Tonsillectomy and adenoidectomy  1993  . Total thyroidectomy  07/31/11   Family History  Problem Relation Age of Onset  . Cancer Maternal Aunt     ovarian  . Cancer Maternal Grandfather     bone   History  Substance Use Topics  . Smoking status: Current Every Day Smoker -- 0.50 packs/day  . Smokeless tobacco: Never Used  . Alcohol Use: Yes   OB History   Grav Para Term Preterm Abortions TAB SAB Ect Mult Living                 Review of Systems  Constitutional:       Per HPI, otherwise negative  HENT:       Per HPI, otherwise negative  Respiratory:       Per HPI, otherwise negative  Cardiovascular:       Per HPI, otherwise negative  Gastrointestinal:  Negative for vomiting.  Endocrine:       Evidence of alopecia  Genitourinary:       Neg aside from HPI   Musculoskeletal:       Per HPI, otherwise negative  Skin: Negative.   Neurological: Negative for syncope.      Allergies  Codeine and Penicillins  Home Medications   Prior to Admission medications   Not on File   BP 123/70  Pulse 71  Temp(Src) 98.1 F (36.7 C) (Oral)  Resp 19  SpO2 100%  LMP 07/01/2014 Physical Exam  Nursing note and vitals reviewed. Constitutional: She is oriented to person, place, and time. She appears well-developed and well-nourished. No distress.  HENT:  Head: Normocephalic and atraumatic.  Eyes: Conjunctivae and EOM are normal.  Cardiovascular: Normal rate and regular rhythm.   Pulmonary/Chest: Effort normal and breath sounds normal. No stridor. No respiratory distress.  Abdominal: She exhibits no distension.  Musculoskeletal: She exhibits no edema.  Neurological: She is alert and oriented to person, place, and time. No cranial nerve deficit.  Skin: Skin is warm and dry.  Psychiatric: She has a normal mood and affect.    ED Course  Procedures (including critical care time) Labs Review Labs Reviewed  CBC - Abnormal; Notable for the following:    Hemoglobin 9.8 (*)    HCT 30.7 (*)    MCV 71.1 (*)    [facility]  22.7 (*)    RDW 20.0 (*)    Platelets 513 (*)    All other components within normal limits  BASIC METABOLIC PANEL - Abnormal; Notable for the following:    Sodium 135 (*)    GFR calc non Af Amer 71 (*)    GFR calc Af Amer 82 (*)    All other components within normal limits  TSH - Abnormal; Notable for the following:    TSH >100.000 (*)    All other components within normal limits  T4, FREE - Abnormal; Notable for the following:    Free T4 0.35 (*)    All other components within normal limits  D-DIMER, QUANTITATIVE  I-STAT TROPOININ, ED  POC URINE PREG, ED    Imaging Review Dg Chest 2 View  07/03/2014   CLINICAL DATA:   Chest pain with shortness of breath and cough ; initial visit  EXAM: CHEST  2 VIEW  COMPARISON:  PA and lateral chest x-ray of August 08, 2011  FINDINGS: The lungs are adequately inflated. There is no focal infiltrate. The heart and pulmonary vascularity within the limits of normal. The mediastinum is normal in width. There is no pleural effusion. The bony thorax is unremarkable.  IMPRESSION: There is no evidence of pneumonia. One cannot exclude acute bronchitis in the appropriate clinical setting.   Electronically Signed   By: David  SwazilandJordan   On: 07/03/2014 17:13     EKG Interpretation   Date/Time:  Wednesday July 03 2014 16:01:47 EDT Ventricular Rate:  104 PR Interval:  146 QRS Duration: 86 QT Interval:  328 QTC Calculation: 431 R Axis:   17 Text Interpretation:  Sinus tachycardia Low voltage QRS Borderline ECG  Sinus tachycardia Artifact Abnormal ekg Confirmed by Gerhard MunchLOCKWOOD, Danaya Geddis  MD  [phone]) on 07/03/2014 9:27:16 PM     After the initial evaluation I reviewed the patient's history with her, specifically discussing the need for thyroid supplementation. In addition we discussed the likely bronchitis, given her history of smoking, x-ray findings.  MDM   Patient with a history of hypothyroidism now presents with dyspnea, chest pain.  Patient's evaluation is reassuring in terms of low suspicion for ongoing ACS, or PE. There is some evidence for bronchitis. The patient has endocrine abnormalities, a short course of steroids is warranted, in addition to additional encouragement for smoking cessation. Patient was discharged in stable condition to follow up with primary care and endocrinology.    Gerhard Munchobert Blimy Napoleon, MD 07/03/14 2128

## 2014-07-03 NOTE — Discharge Instructions (Signed)
As discussed, your evaluation today is largely reassuring.  It is very important that you followup with your primary care physician and an endocrinologist.  Your symptoms are likely due to bronchitis, which is made worse by your smoking, and your thyroid dysfunction.  Please take all medication as directed, and return here for any concerning changes in your condition.

## 2014-07-03 NOTE — ED Notes (Signed)
Presents with SOB, intermittent left sided sharp chest pain and non compliance with levothyroxine medication for the past few months. Pt is sob, SAts 100%, she reports feelilng anxious as well. She quit taking medication due to hair falling out. Last night she began having sharp chest pain when increased her anxiety. The past few months she has been feeling SOB worse with lying down and sitting, better with exertion. Breath sounds clear. Able to speak in full sentences.  Deneis pain at this time.

## 2014-07-04 ENCOUNTER — Telehealth: Payer: Self-pay | Admitting: Family Medicine

## 2014-07-04 NOTE — Telephone Encounter (Signed)
Wants dr Gayla Dossjoyner to review her labs and call her if needed

## 2014-07-29 ENCOUNTER — Ambulatory Visit (INDEPENDENT_AMBULATORY_CARE_PROVIDER_SITE_OTHER): Payer: Self-pay | Admitting: Family Medicine

## 2014-07-29 ENCOUNTER — Encounter: Payer: Self-pay | Admitting: Family Medicine

## 2014-07-29 VITALS — BP 137/83 | HR 82 | Temp 98.3°F | Resp 16 | Wt 221.0 lb

## 2014-07-29 DIAGNOSIS — E890 Postprocedural hypothyroidism: Secondary | ICD-10-CM

## 2014-07-29 MED ORDER — LEVOTHYROXINE SODIUM 175 MCG PO TABS: 175.0000 ug | ORAL_TABLET | Freq: Every day | ORAL | Status: DC

## 2014-07-29 NOTE — Progress Notes (Signed)
  Patient name: Waynetta PeanCrystal G FritzDoughty MRN [id number]  Date of birth: 12/12/1975  CC & HPI:  Lester CarolinaCrystal G FritzDoughty is a 38 y.o. female presenting today for fatigue. She continues to reports low energy, fatigue, heavy periods and inability to loss weight despite dieting. She reports stopping her thyroid medications for several months, but recently restarted ~ 1 month ago at ED visit. She request phentermine today to help with energy and weight loss. She denies current CP, SOB, LE swelling.   ROS: See HPI   Medical & Surgical Hx:  Reviewed  Medications & Allergies: Reviewed  Social History: Reviewed:   Objective Findings:  Vitals: BP 137/83 mmHg  Pulse 82  Temp(Src) 98.3 F (36.8 C) (Oral)  Resp 16  Wt 221 lb (100.245 kg)  SpO2 100%  LMP 07/29/2014  Gen: NAD CV: RRR w/o m/r/g, pulses +2 b/l; No LE swelling Resp: CTAB w/ normal respiratory effort   Assessment & Plan:   Please See Problem Focused Assessment & Plan

## 2014-07-29 NOTE — Assessment & Plan Note (Signed)
Still noncompliant with synthroid; ED visit on 07/04/14 for CP with non concerning work-up - restarted Synthroid at that time - Refilled Synthroid 175 mcg daily - Lab visit in 2-3 weeks for TSH check and BMP, CBC; Will attempt to titrate synthroid with lab visit only and telephone follow-up as she reports financial difficulties with copays partial reason for noncompliance - She requested Phentermine today to help with weight loss, energy. Denied - due to concern for heart failure / arrhythmias given uncontrolled hypothyroidism  - Consider SSRI at next visit

## 2014-08-19 ENCOUNTER — Other Ambulatory Visit: Payer: Self-pay

## 2014-11-19 ENCOUNTER — Telehealth: Payer: Self-pay | Admitting: Family Medicine

## 2014-11-19 NOTE — Telephone Encounter (Signed)
Pt called and would like to have a prescription of Phenergan. Please call patient at [phone]. She is going out of town. Counselling psychologistjw

## 2014-11-19 NOTE — Telephone Encounter (Signed)
Please call patient and inform her i'm unable to call in this prescription. I've never prescribed it for her and she hasn't been prescribed it in over 2 years per EPIC. Can't prescribe medication to treat unknown symptoms that i've never evaluated. If she need to be seen soon she can go to urgent care or make same day appointment.   Thanks,  jimmy

## 2014-11-19 NOTE — Telephone Encounter (Signed)
LM for patient to call back.  Please give an appt for patient to be seen this week.  [facility] an appt is available here. Thanks Limited BrandsJazmin Scotland Korver,CMA

## 2015-01-10 ENCOUNTER — Other Ambulatory Visit: Payer: Self-pay

## 2015-01-10 DIAGNOSIS — E039 Hypothyroidism, unspecified: Secondary | ICD-10-CM

## 2015-01-10 DIAGNOSIS — D649 Anemia, unspecified: Secondary | ICD-10-CM

## 2015-01-10 LAB — CBC
HCT: 35.5 % — ABNORMAL LOW (ref 36.0–46.0)
Hemoglobin: 10.6 g/dL — ABNORMAL LOW (ref 12.0–15.0)
MCH: 20.9 pg — ABNORMAL LOW (ref 26.0–34.0)
MCHC: 29.9 g/dL — ABNORMAL LOW (ref 30.0–36.0)
MCV: 70 fL — ABNORMAL LOW (ref 78.0–100.0)
MPV: 9.4 fL (ref 8.6–12.4)
PLATELETS: 515 10*3/uL — AB (ref 150–400)
RBC: 5.07 MIL/uL (ref 3.87–5.11)
RDW: 19.5 % — AB (ref 11.5–15.5)
WBC: 10.5 10*3/uL (ref 4.0–10.5)

## 2015-01-10 LAB — TSH: TSH: 0.308 u[IU]/mL — ABNORMAL LOW (ref 0.350–4.500)

## 2015-01-10 NOTE — Progress Notes (Signed)
[facility] phlebotomist drew:  TSH, CBC

## 2015-01-14 ENCOUNTER — Telehealth: Payer: Self-pay | Admitting: Family Medicine

## 2015-01-14 NOTE — Telephone Encounter (Signed)
Pt called and wanted to know her lab results. jw

## 2015-01-14 NOTE — Telephone Encounter (Signed)
Will forward to MD to check on labs.  [name] Hartsell,CMA  

## 2015-01-14 NOTE — Telephone Encounter (Signed)
Called and discussed TSH and CBC results. TSH mildly low, so would likely need to decrease synthroid dose slightly or also discussed with patient that she could continue this dose and repeat blood test in a few months. CBC with stable/slightly improved hemoglobin but still with low MCV and high RDW consistent with iron def anemia (pt reports taking an iron supplement irregularly). She had her lab work done because she has been feeling tired/fatigued lately. I discussed that she should call clinic tomorrow and schedule an office visit with Dr. Gayla DossJoyner in the next 1-2 weeks. Pt agrees and will call. -Dr. Waynetta SandyWight

## 2015-02-21 ENCOUNTER — Telehealth: Payer: Self-pay | Admitting: Family Medicine

## 2015-02-21 ENCOUNTER — Ambulatory Visit (INDEPENDENT_AMBULATORY_CARE_PROVIDER_SITE_OTHER): Payer: Medicaid Other | Admitting: Family Medicine

## 2015-02-21 ENCOUNTER — Encounter: Payer: Self-pay | Admitting: Family Medicine

## 2015-02-21 VITALS — BP 112/83 | HR 92 | Temp 98.4°F | Ht 70.0 in | Wt 232.0 lb

## 2015-02-21 DIAGNOSIS — H938X3 Other specified disorders of ear, bilateral: Secondary | ICD-10-CM | POA: Diagnosis not present

## 2015-02-21 DIAGNOSIS — S46811A Strain of other muscles, fascia and tendons at shoulder and upper arm level, right arm, initial encounter: Secondary | ICD-10-CM

## 2015-02-21 DIAGNOSIS — E890 Postprocedural hypothyroidism: Secondary | ICD-10-CM

## 2015-02-21 HISTORY — DX: Strain of other muscles, fascia and tendons at shoulder and upper arm level, right arm, initial encounter: S46.811A

## 2015-02-21 MED ORDER — CYCLOBENZAPRINE HCL 5 MG PO TABS: 5.0000 mg | ORAL_TABLET | Freq: Three times a day (TID) | ORAL | Status: DC | PRN

## 2015-02-21 MED ORDER — MELOXICAM 15 MG PO TABS: 15.0000 mg | ORAL_TABLET | Freq: Every day | ORAL | Status: DC

## 2015-02-21 NOTE — Assessment & Plan Note (Signed)
It does not seems that her pain is coming from the neck but the trapezius muscle. Likely muscle strain. Right should looks ok. Trial of NSAID and flexeril prn pain. F/U in 2-3 wks with PCP for reassessment. Return precaution discussed.

## 2015-02-21 NOTE — Telephone Encounter (Signed)
Refill request from pt. Will forward to PCP for review. Nasiir Monts, CMA. 

## 2015-02-21 NOTE — Telephone Encounter (Signed)
Pt called and needs a refill on her thyroid medication . jw °

## 2015-02-21 NOTE — Patient Instructions (Signed)

## 2015-02-21 NOTE — Progress Notes (Signed)
Subjective:     Patient ID: Katie Obrien, female   DOB: [birth year], 39 y.o.   MRN: [id number]  HPI Neck pain/Shoulder: 3 wks ago, woke up with sharp pain on the right side of her neck, and has since then spread to her right shoulder. Pain is on and off, about 5/10 in severity, worse at night when trying to sleep at night, pain is sharp in nature and at times feels like a pulling of the muscle around her neck. She uses Flexeril and Ibuprofen with no improvement.She feels pins and needle sensation on her shoulder whenever she lay down at night occasionally. Denies any recent trauma or injury to her neck. Ear irritation: Feels like there is fluid in her ear on both sides, she uses q-tip to clean up, the right ear started bleeding 3 days ago after use of q-tip. She denies any pain, just a feeling of something  In his ears.  Current Outpatient Prescriptions on File Prior to Visit  Medication Sig Dispense Refill  . levothyroxine (SYNTHROID, LEVOTHROID) 175 MCG tablet Take 1 tablet (175 mcg total) by mouth daily before breakfast. 32 tablet 5   No current facility-administered medications on file prior to visit.   Past Medical History  Diagnosis Date  . Migraine   . Hyperthyroidism   . Hypertension   . Grave's disease   . Graves disease      Review of Systems  Constitutional: Negative.  Negative for fever.  HENT: Negative for ear discharge.        Ear fullness.  Respiratory: Negative.   Cardiovascular: Negative.   Gastrointestinal: Negative.   Musculoskeletal:       Pain at the base of her neck and shoulder pain.  All other systems reviewed and are negative.      Filed Vitals:   02/21/15 0856  BP: 112/83  Pulse: 92  Temp: 98.4 F (36.9 C)  TempSrc: Oral  Height: 5\' 10"  (1.778 m)  Weight: 232 lb (105.235 kg)    Objective:   Physical Exam  HENT:  Head: Normocephalic and atraumatic.  Right Ear: Tympanic membrane, external ear and ear canal normal.  Left Ear:  Tympanic membrane, external ear and ear canal normal.  Mouth/Throat: Oropharynx is clear and moist.  Neck: Normal range of motion and full passive range of motion without pain. Neck supple. Muscular tenderness present. No rigidity. No edema, no erythema and normal range of motion present. No thyroid mass and no thyromegaly present.    Cardiovascular: Normal rate, regular rhythm and normal heart sounds.   No murmur heard. Pulmonary/Chest: Effort normal and breath sounds normal.  Abdominal: Normal appearance. There is no tenderness.       Assessment:     Shoulder/neck pain: Muscle strain Ear fullness     Plan:     Check problem list.

## 2015-02-21 NOTE — Assessment & Plan Note (Signed)
Ear exam benign. Patient reassured. Sensation my be due to seasonal allergy. May try OTC zyrtec to see if this will help. Monitor for now.

## 2015-02-24 MED ORDER — LEVOTHYROXINE SODIUM 175 MCG PO TABS: 175.0000 ug | ORAL_TABLET | Freq: Every day | ORAL | Status: DC

## 2015-06-15 ENCOUNTER — Emergency Department
Admission: EM | Admit: 2015-06-15 | Discharge: 2015-06-15 | Disposition: A | Discharge status: Home / Self Care | Payer: Medicaid Other | Attending: Emergency Medicine | Admitting: Emergency Medicine

## 2015-06-15 DIAGNOSIS — L03115 Cellulitis of right lower limb: Secondary | ICD-10-CM | POA: Insufficient documentation

## 2015-06-15 DIAGNOSIS — I10 Essential (primary) hypertension: Secondary | ICD-10-CM | POA: Diagnosis not present

## 2015-06-15 DIAGNOSIS — L89891 Pressure ulcer of other site, stage 1: Secondary | ICD-10-CM | POA: Insufficient documentation

## 2015-06-15 DIAGNOSIS — Z720 Tobacco use: Secondary | ICD-10-CM | POA: Insufficient documentation

## 2015-06-15 DIAGNOSIS — M79671 Pain in right foot: Secondary | ICD-10-CM | POA: Diagnosis present

## 2015-06-15 DIAGNOSIS — Z79899 Other long term (current) drug therapy: Secondary | ICD-10-CM | POA: Diagnosis not present

## 2015-06-15 DIAGNOSIS — L03119 Cellulitis of unspecified part of limb: Secondary | ICD-10-CM

## 2015-06-15 MED ORDER — IBUPROFEN 800 MG PO TABS: 800.0000 mg | ORAL_TABLET | Freq: Three times a day (TID) | ORAL | Status: DC | PRN

## 2015-06-15 MED ORDER — CIPROFLOXACIN HCL 500 MG PO TABS: 500.0000 mg | ORAL_TABLET | Freq: Two times a day (BID) | ORAL | Status: AC

## 2015-06-15 MED ORDER — SULFAMETHOXAZOLE-TRIMETHOPRIM 800-160 MG PO TABS: 1.0000 | ORAL_TABLET | Freq: Two times a day (BID) | ORAL | Status: DC

## 2015-06-15 MED ORDER — TRAMADOL HCL 50 MG PO TABS: 50.0000 mg | ORAL_TABLET | Freq: Four times a day (QID) | ORAL | Status: DC | PRN
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
## 2015-06-15 NOTE — ED Notes (Signed)
Pt states she had a boil to the side of her right  foot after wearing some new shoes on Friday. Area began  to swell until she opened it and drained dark brown pus drainage. Continued pain and swelling to area.

## 2015-06-15 NOTE — ED Notes (Signed)
STATES INITIALLY INJURED FOOT BY WEARING SHOES TO WORK.  INJURY IMPROVED AND THOUGHT AREA WAS HEALED (APPROXIMATELY 2 WEEKS).  wHILE AT [facility] THIS WEEKEND, PAIN AWOKE PATIENT FROM SLEEP.  aREA DRAINING "BROWN PUS".

## 2015-06-15 NOTE — Discharge Instructions (Signed)
Pressure Ulcer A pressure ulcer is a sore that has formed from the breakdown of skin and exposure of deeper layers of tissue. It develops in areas of the body where there is unrelieved pressure. Pressure ulcers are usually found over a bony area, such as the shoulder blades, spine, lower back, hips, knees, ankles, and heels. Pressure ulcers vary in severity. Your health care provider may determine the severity (stage) of your pressure ulcer. The stages include:  Stage I--The skin is red, and when the skin is pressed, it stays red.  Stage II--The top layer of skin is gone, and there is a shallow, pink ulcer.  Stage III--The ulcer becomes deeper, and it is more difficult to see the whole wound. Also, there may be yellow or brown parts, as well as pink and red parts.  Stage IV--The ulcer may be deep and red, pink, brown, white, or yellow. Bone or muscle may be seen.  Unstageable pressure ulcer--The ulcer is covered almost completely with black, brown, or yellow tissue. It is not known how deep the ulcer is or what stage it is until this covering comes off.  Suspected deep tissue injury--A person's skin can be injured from pressure or pulling on the skin when his or her position is changed. The skin appears purple or maroon. There may not be an opening in the skin, but there could be a blood-filled blister. This deep tissue injury is often difficult to see in people with darker skin tones. The site may open and become deeper in time. However, early interventions will help the area heal and may prevent the area from opening. CAUSES  Pressure ulcers are caused by pressure against the skin that limits the flow of blood to the skin and nearby tissues. There are many risk factors that can lead to pressure sores. RISK FACTORS  Decreased ability to move.  Decreased ability to feel pain or discomfort.  Excessive skin moisture from urine, stool, sweat, or secretions.  Poor  nutrition.  Dehydration.  Tobacco, drug, or alcohol abuse.  Having someone pull on bedsheets that are under you, such as when health care workers are changing your position in a hospital bed.  Obesity.  Increased adult age.  Hospitalization in a critical care unit for longer than 4 days with use of medical devices.  Prolonged use of medical devices.  Critical illness.  Anemia.  Traumatic brain injury.  Spinal cord injury.  Stroke.  Diabetes.  Poor blood glucose control.  Low blood pressure (hypotension).  Low oxygen levels.  Medicines that reduce blood flow.  Infection. DIAGNOSIS  Your health care provider will diagnose your pressure ulcer based on its appearance. The health care provider may determine the stage of your pressure ulcer as well. Tests may be done to check for infection, to assess your circulation, or to check for other diseases, such as diabetes. TREATMENT  Treatment of your pressure ulcer begins with determining what stage the ulcer is in. Your treatment team may include your health care provider, a wound care specialist, a nutritionist, a physical therapist, and a surgeon. Possible treatments may include:   Moving or repositioning every 1-2 hours.  Using beds or mattresses to shift your body weight and pressure points frequently.  Improving your diet.  Cleaning and bandaging (dressing) the open wound.  Giving antibiotic medicines.  Removing damaged tissue.  Surgery and sometimes skin grafts. HOME CARE INSTRUCTIONS  If you were hospitalized, follow the care plan that was started in the hospital.    Avoid staying in the same position for more than 2 hours. Use padding, devices, or mattresses to cushion your pressure points as directed by your health care provider.  Eat a well-balanced diet. Take nutritional supplements and vitamins as directed by your health care provider.  Keep all follow-up appointments.  Only take over-the-counter or  prescription medicines for pain, fever, or discomfort as directed by your health care provider. SEEK MEDICAL CARE IF:   Your pressure ulcer is not improving.  You do not know how to care for your pressure ulcer.  You notice other areas of redness on your skin.  You have a fever. SEEK IMMEDIATE MEDICAL CARE IF:   You have increasing redness, swelling, or pain in your pressure ulcer.  You notice pus coming from your pressure ulcer.  You notice a bad smell coming from the wound or dressing.  Your pressure ulcer opens up again. Document Released: 09/13/2005 Document Revised: 09/18/2013 Document Reviewed: 05/21/2013 Exit[employer] Patient Information 2015 [employer]. This information is not intended to replace advice given to you by your health care provider. Make sure you discuss any questions you have with your health care provider.  

## 2015-06-15 NOTE — ED Notes (Signed)
AaoX3.  SKIN WARM AND DRY.  AMBULATES WITH EASY AND STEADY GAIT.  D/C HOME

## 2015-06-18 ENCOUNTER — Emergency Department
Admission: EM | Admit: 2015-06-18 | Discharge: 2015-06-18 | Disposition: A | Discharge status: Home / Self Care | Payer: Medicaid Other | Attending: Emergency Medicine | Admitting: Emergency Medicine

## 2015-06-18 DIAGNOSIS — I10 Essential (primary) hypertension: Secondary | ICD-10-CM | POA: Diagnosis not present

## 2015-06-18 DIAGNOSIS — Z792 Long term (current) use of antibiotics: Secondary | ICD-10-CM | POA: Diagnosis not present

## 2015-06-18 DIAGNOSIS — Z720 Tobacco use: Secondary | ICD-10-CM | POA: Diagnosis not present

## 2015-06-18 DIAGNOSIS — R21 Rash and other nonspecific skin eruption: Secondary | ICD-10-CM | POA: Insufficient documentation

## 2015-06-18 DIAGNOSIS — M79671 Pain in right foot: Secondary | ICD-10-CM | POA: Diagnosis present

## 2015-06-18 DIAGNOSIS — L03115 Cellulitis of right lower limb: Secondary | ICD-10-CM

## 2015-06-18 DIAGNOSIS — Z880 Allergy status to penicillin: Secondary | ICD-10-CM | POA: Insufficient documentation

## 2015-06-18 DIAGNOSIS — Z79899 Other long term (current) drug therapy: Secondary | ICD-10-CM | POA: Diagnosis not present

## 2015-06-18 MED ORDER — ONDANSETRON HCL 4 MG PO TABS: 4.0000 mg | ORAL_TABLET | Freq: Three times a day (TID) | ORAL | Status: AC | PRN

## 2015-06-18 MED ORDER — DOXYCYCLINE HYCLATE 50 MG PO CAPS: 50.0000 mg | ORAL_CAPSULE | Freq: Two times a day (BID) | ORAL | Status: AC

## 2015-06-18 NOTE — Discharge Instructions (Signed)

## 2015-06-18 NOTE — ED Provider Notes (Signed)
[facility] Emergency Department Provider Note  ____________________________________________  Time seen: Approximately 8:32 PM  I have reviewed the triage vital signs and the nursing notes.   HISTORY  Chief Complaint Foot Pain    HPI Lorna G FritzDoughty is a 39 y.o. female presents to the emergency department for reevaluation of right foot pain, swelling. She was seen 2 days ago and diagnosed with a pressure ulcer/cellulitis of right foot and placed on antibiotics. She returns due to no visible improvement of same. Area is still tender, and swollen. She has taken her antibiotics as prescribed. She denies any worsening of symptoms.   Past Medical History  Diagnosis Date  . Migraine   . Hyperthyroidism   . Hypertension   . Grave's disease   . Graves disease     Patient Active Problem List   Diagnosis Date Noted  . Strain of right trapezius muscle 02/21/2015  . Sensation of fullness in both ears 02/21/2015  . IUD check up 02/07/2014  . Breast pain, left 01/08/2014  . Anemia 01/08/2014  . Migraine with status migrainosus 01/08/2014  . Obesity (BMI 30-39.9) 02/02/2013  . Hypothyroidism, postop 11/23/2011  . Menorrhagia 06/28/2011  . DEPRESSION 11/20/2009  . TOBACCO USER 06/14/2009  . ANXIETY 11/08/2008    Past Surgical History  Procedure Laterality Date  . Tubal ligation    . Cesarean section  1998, 2001  . Tonsillectomy and adenoidectomy  1993  . Total thyroidectomy  07/31/11    Current Outpatient Rx  Name  Route  Sig  Dispense  Refill  . ciprofloxacin (CIPRO) 500 MG tablet   Oral   Take 1 tablet (500 mg total) by mouth 2 (two) times daily. X 7 days   14 tablet   0   . cyclobenzaprine (FLEXERIL) 5 MG tablet   Oral   Take 1 tablet (5 mg total) by mouth 3 (three) times daily as needed for muscle spasms.   20 tablet   0   . doxycycline (VIBRAMYCIN) 50 MG capsule   Oral   Take 1 capsule (50 mg total) by mouth 2 (two) times daily.   14 capsule   0   . ibuprofen (ADVIL,MOTRIN) 200 MG tablet   Oral   Take 200 mg by mouth every 6 (six) hours as needed.         Marland Kitchen ibuprofen (ADVIL,MOTRIN) 800 MG tablet   Oral   Take 1 tablet (800 mg total) by mouth every 8 (eight) hours as needed.   30 tablet   0   . levothyroxine (SYNTHROID, LEVOTHROID) 175 MCG tablet   Oral   Take 1 tablet (175 mcg total) by mouth daily before breakfast.   32 tablet   2   . ondansetron (ZOFRAN) 4 MG tablet   Oral   Take 1 tablet (4 mg total) by mouth every 8 (eight) hours as needed for nausea or vomiting.   20 tablet   0   . traMADol (ULTRAM) 50 MG tablet   Oral   Take 1 tablet (50 mg total) by mouth every 6 (six) hours as needed.   20 tablet   0     Allergies Codeine and Penicillins  Family History  Problem Relation Age of Onset  . Cancer Maternal Aunt     ovarian  . Cancer Maternal Grandfather     bone    Social History Social History  Substance Use Topics  . Smoking status: Current Every Day Smoker -- 0.50 packs/day  .  Smokeless tobacco: Never Used  . Alcohol Use: Yes    Review of Systems Constitutional: No fever/chills Eyes: No visual changes. ENT: No sore throat. Cardiovascular: Denies chest pain. Respiratory: Denies shortness of breath. Gastrointestinal: No abdominal pain.  No nausea, no vomiting.  No diarrhea.  No constipation. Genitourinary: Negative for dysuria. Musculoskeletal: Negative for back pain. Skin: Negative for rash. Positive for cellulitis of right foot. Neurological: Negative for headaches, focal weakness or numbness.  10-point ROS otherwise negative.  ____________________________________________   PHYSICAL EXAM:  VITAL SIGNS: ED Triage Vitals  Enc Vitals Group     BP 06/18/15 2010 114/60 mmHg     Pulse Rate 06/18/15 2010 81     Resp 06/18/15 2010 18     Temp 06/18/15 2010 97.6 F (36.4 C)     Temp Source 06/18/15 2010 Oral     SpO2 06/18/15 2010 98 %     Weight 06/18/15 2010  180 lb (81.647 kg)     Height 06/18/15 2010  (1.803 m)     Head Cir --      Peak Flow --      Pain Score --      Pain Loc --      Pain Edu? --      Excl. in GC? --     Constitutional: Alert and oriented. Well appearing and in no acute distress. Eyes: Conjunctivae are normal. PERRL. EOMI. Head: Atraumatic. Nose: No congestion/rhinnorhea. Mouth/Throat: Mucous membranes are moist.  Oropharynx non-erythematous. Neck: No stridor.   Cardiovascular: Normal rate, regular rhythm. Grossly normal heart sounds.  Good peripheral circulation. Respiratory: Normal respiratory effort.  No retractions. Lungs CTAB. Gastrointestinal: Soft and nontender. No distention. No abdominal bruits. No CVA tenderness. Musculoskeletal: No lower extremity tenderness nor edema.  No joint effusions. Neurologic:  Normal speech and language. No gross focal neurologic deficits are appreciated. No gait instability. Skin:  Skin is warm, dry and intact. Mild erythematous maculopapular rash on chest. Raised, erythematous, weeping lesion to right foot just proximal to the fifth floor EMG on the dorsal aspect. No fluctuance noted. Area is roughly 0.5 x 0.5 cm. Mild nonpurulent drainage noted. Psychiatric: Mood and affect are normal. Speech and behavior are normal.  ____________________________________________   LABS (all labs ordered are listed, but only abnormal results are displayed)  Labs Reviewed - No data to display ____________________________________________  EKG   ____________________________________________  RADIOLOGY   ____________________________________________   PROCEDURES  Procedure(s) performed: None  Critical Care performed: No  ____________________________________________   INITIAL IMPRESSION / ASSESSMENT AND PLAN / ED COURSE  Pertinent labs & imaging results that were available during my care of the patient were reviewed by me and considered in my medical decision making (see chart  for details).  Area showing signs of improvement. Patient is just concerned that area has not fully healed and was instructed to return in 3 days if no healing. Advised patient to continue antibiotic use throughout the prescribed. Switching antibiotics to doxycycline and Cipro due to a possible reaction to sulfa medication. Advised patient to take probiotic as well. Follow-up with primary care for continued care. Patient understands, and verbalizes agreement and compliance. ____________________________________________   FINAL CLINICAL IMPRESSION(S) / ED DIAGNOSES  Final diagnoses:  Cellulitis of right foot      Racheal Patches, PA-C 06/18/15 2146  Sharyn Creamer, MD 06/18/15 2216

## 2015-06-18 NOTE — ED Notes (Addendum)
Patient ambulatory to triage with steady gait, without difficulty or distress noted; pt reports seen here Sunday for cut to right foot and rx antibiotics but feels as if infection is not improving

## 2015-06-25 ENCOUNTER — Telehealth: Payer: Self-pay | Admitting: Family Medicine

## 2015-06-25 NOTE — Telephone Encounter (Signed)
Pt called and is asking if her provider could change her levothyroxine to the brand name of Synthroid itself. The reason being is that she feels like her current medication isn't working and she states that she researched it and she believes that changing her medication from the generic to the brand would be helpful. Please advise, thank you, Dorothey Baseman, ASA

## 2015-06-26 ENCOUNTER — Other Ambulatory Visit: Payer: Self-pay | Admitting: Family Medicine

## 2015-06-26 DIAGNOSIS — E890 Postprocedural hypothyroidism: Secondary | ICD-10-CM

## 2015-06-26 MED ORDER — LEVOTHYROXINE SODIUM 175 MCG PO TABS: 175.0000 ug | ORAL_TABLET | Freq: Every day | ORAL | Status: DC

## 2015-06-26 NOTE — Telephone Encounter (Signed)
Please call.  The most important thing is to stay consistent with the brand of thyroid medication she is taking.  I can change to the brand name of Synthroid, but she would need a clinic visit to check her thyroid level within 3-4 weeks of starting a medication.  If she stills wants to make this change then she should schedule a clinic visit in 1 month and I'll send in 1 month Rx for  Synthroid.

## 2015-06-26 NOTE — Telephone Encounter (Signed)
Spoke with patient and she is fine with rechecking her levels in 1 month.  She already has an appt on 07/24/15. [name] Hartsell,CMA

## 2015-07-24 ENCOUNTER — Ambulatory Visit (INDEPENDENT_AMBULATORY_CARE_PROVIDER_SITE_OTHER): Payer: Medicaid Other | Admitting: Family Medicine

## 2015-07-24 ENCOUNTER — Encounter: Payer: Self-pay | Admitting: Family Medicine

## 2015-07-24 VITALS — BP 137/89 | HR 77 | Temp 98.4°F | Ht 71.0 in | Wt 231.6 lb

## 2015-07-24 DIAGNOSIS — F17200 Nicotine dependence, unspecified, uncomplicated: Secondary | ICD-10-CM

## 2015-07-24 DIAGNOSIS — E890 Postprocedural hypothyroidism: Secondary | ICD-10-CM | POA: Diagnosis not present

## 2015-07-24 DIAGNOSIS — D500 Iron deficiency anemia secondary to blood loss (chronic): Secondary | ICD-10-CM

## 2015-07-24 LAB — COMPREHENSIVE METABOLIC PANEL
ALT: 10 U/L (ref 6–29)
AST: 15 U/L (ref 10–30)
Albumin: 4.1 g/dL (ref 3.6–5.1)
Alkaline Phosphatase: 35 U/L (ref 33–115)
BUN: 10 mg/dL (ref 7–25)
CALCIUM: 8.5 mg/dL — AB (ref 8.6–10.2)
CHLORIDE: 100 mmol/L (ref 98–110)
CO2: 24 mmol/L (ref 20–31)
Creat: 0.77 mg/dL (ref 0.50–1.10)
Glucose, Bld: 88 mg/dL (ref 65–99)
Potassium: 4.4 mmol/L (ref 3.5–5.3)
SODIUM: 137 mmol/L (ref 135–146)
Total Bilirubin: 0.2 mg/dL (ref 0.2–1.2)
Total Protein: 6.9 g/dL (ref 6.1–8.1)

## 2015-07-24 LAB — CBC
HEMATOCRIT: 31.9 % — AB (ref 36.0–46.0)
HEMOGLOBIN: 10 g/dL — AB (ref 12.0–15.0)
MCH: 22.8 pg — ABNORMAL LOW (ref 26.0–34.0)
MCHC: 31.3 g/dL (ref 30.0–36.0)
MCV: 72.8 fL — ABNORMAL LOW (ref 78.0–100.0)
MPV: 9.6 fL (ref 8.6–12.4)
Platelets: 499 10*3/uL — ABNORMAL HIGH (ref 150–400)
RBC: 4.38 MIL/uL (ref 3.87–5.11)
RDW: 18.7 % — ABNORMAL HIGH (ref 11.5–15.5)
WBC: 8.6 10*3/uL (ref 4.0–10.5)

## 2015-07-24 MED ORDER — LEVOTHYROXINE SODIUM 175 MCG PO TABS: 175.0000 ug | ORAL_TABLET | Freq: Every day | ORAL | Status: DC

## 2015-07-24 NOTE — Assessment & Plan Note (Signed)
Check CBC - continue multivitamin and iron

## 2015-07-24 NOTE — Assessment & Plan Note (Signed)
Refilled Synthroid - Check TSH - Advised that I would consider phentermine if blood work today is normal - f/u in 3 months

## 2015-07-24 NOTE — Assessment & Plan Note (Signed)
Quit 2 weeks ago. Using 21 mg patch - advised reducing to 14 mg patch

## 2015-07-24 NOTE — Progress Notes (Signed)
  Patient name: Katie PeanCrystal G Obrien MRN [id number]  Date of birth: 03/31/1976  CC & HPI:  Lester CarolinaCrystal G Obrien is a 39 y.o. female presenting today for hypothyroidism, anemia, tobacco abuse.   Hypothyroidism - She reports compliance with her new Synthroid,  - Also reports improvement in mood and energy - She is extremely concerned about her weekend in the past couple months and is really making an effort to be consistent with her Synthroid.  Taking Synthroid on an empty stomach first in the morning, 30 minutes prior to her Red bull - Interested in phentermine for weight loss  Anemia - Continues to have problems with menorrhagia; heavy periods, bleeding through multiple pads and tampons soaking clothes 7 days out of the month - Taking multivitamin and iron  Tobacco abuse - Quit smoking 2 weeks ago - Currently using 20 mg nicotine patch  ROS: See HPI   Medical & Surgical Hx:  Reviewed  Medications & Allergies: Reviewed  Social History: Reviewed:   Objective Findings:  Vitals: BP 137/89 mmHg  Pulse 77  Temp(Src) 98.4 F (36.9 C) (Oral)  Ht 5\' 11"  (1.803 m)  Wt 231 lb 9 oz (105.036 kg)  BMI 32.31 kg/m2  LMP 07/11/2015  Gen: NAD; Obese Neck: Thyroid nonpalpable  CV: RRR w/o m/r/g, pulses +2 b/l Resp: CTAB w/ normal respiratory effort  Assessment & Plan:   Hypothyroidism, postop Refilled Synthroid 175mcg - Check TSH - Advised that I would consider phentermine if blood work today is normal - f/u in 3 months  TOBACCO USER Quit 2 weeks ago. Using 21 mg patch - advised reducing to 14 mg patch  Anemia Check CBC - continue multivitamin and iron

## 2015-07-25 LAB — TSH: TSH: 1.417 u[IU]/mL (ref 0.350–4.500)

## 2015-07-30 ENCOUNTER — Telehealth: Payer: Self-pay | Admitting: Family Medicine

## 2015-07-30 DIAGNOSIS — E669 Obesity, unspecified: Secondary | ICD-10-CM

## 2015-07-30 MED ORDER — PHENTERMINE-TOPIRAMATE ER 3.75-23 MG PO CP24: ORAL_CAPSULE | ORAL | Status: DC

## 2015-07-30 NOTE — Telephone Encounter (Signed)
Called patient and discussed her recent blood work. TSH normal but she remains anemic due to her menorrhagia. Recommended continuing iron supplements. If her periods do not improve with control of her thyroid will refer to GYN. Start Phentermine-topiramate for weight loss. Discussed potential side-effects and she is to call if she develops CP, SOB, Palpitations, or headaches. Follow-up in 1 month for weight loss.

## 2015-07-31 ENCOUNTER — Telehealth: Payer: Self-pay | Admitting: Family Medicine

## 2015-07-31 NOTE — Telephone Encounter (Signed)
Would like to get Rx for phetermine-plain.  The combo is over $300.  Plain is just $30 Please advise

## 2015-07-31 NOTE — Telephone Encounter (Signed)
Will forward to MD. [name] Hartsell,CMA  

## 2015-08-01 MED ORDER — PHENTERMINE HCL 15 MG PO CAPS: 15.0000 mg | ORAL_CAPSULE | ORAL | Status: DC

## 2015-11-03 ENCOUNTER — Other Ambulatory Visit: Payer: Self-pay | Admitting: *Deleted

## 2015-11-03 DIAGNOSIS — E890 Postprocedural hypothyroidism: Secondary | ICD-10-CM

## 2015-11-03 IMAGING — CR DG CHEST 2V
2 series · 2 of 2 positions shown · non-contrast
Comparison: PA and lateral chest x-ray August 08, 2011

CLINICAL DATA: Chest pain with shortness of breath and cough ;
initial visit

EXAM:
CHEST  2 VIEW

[w chest pa]
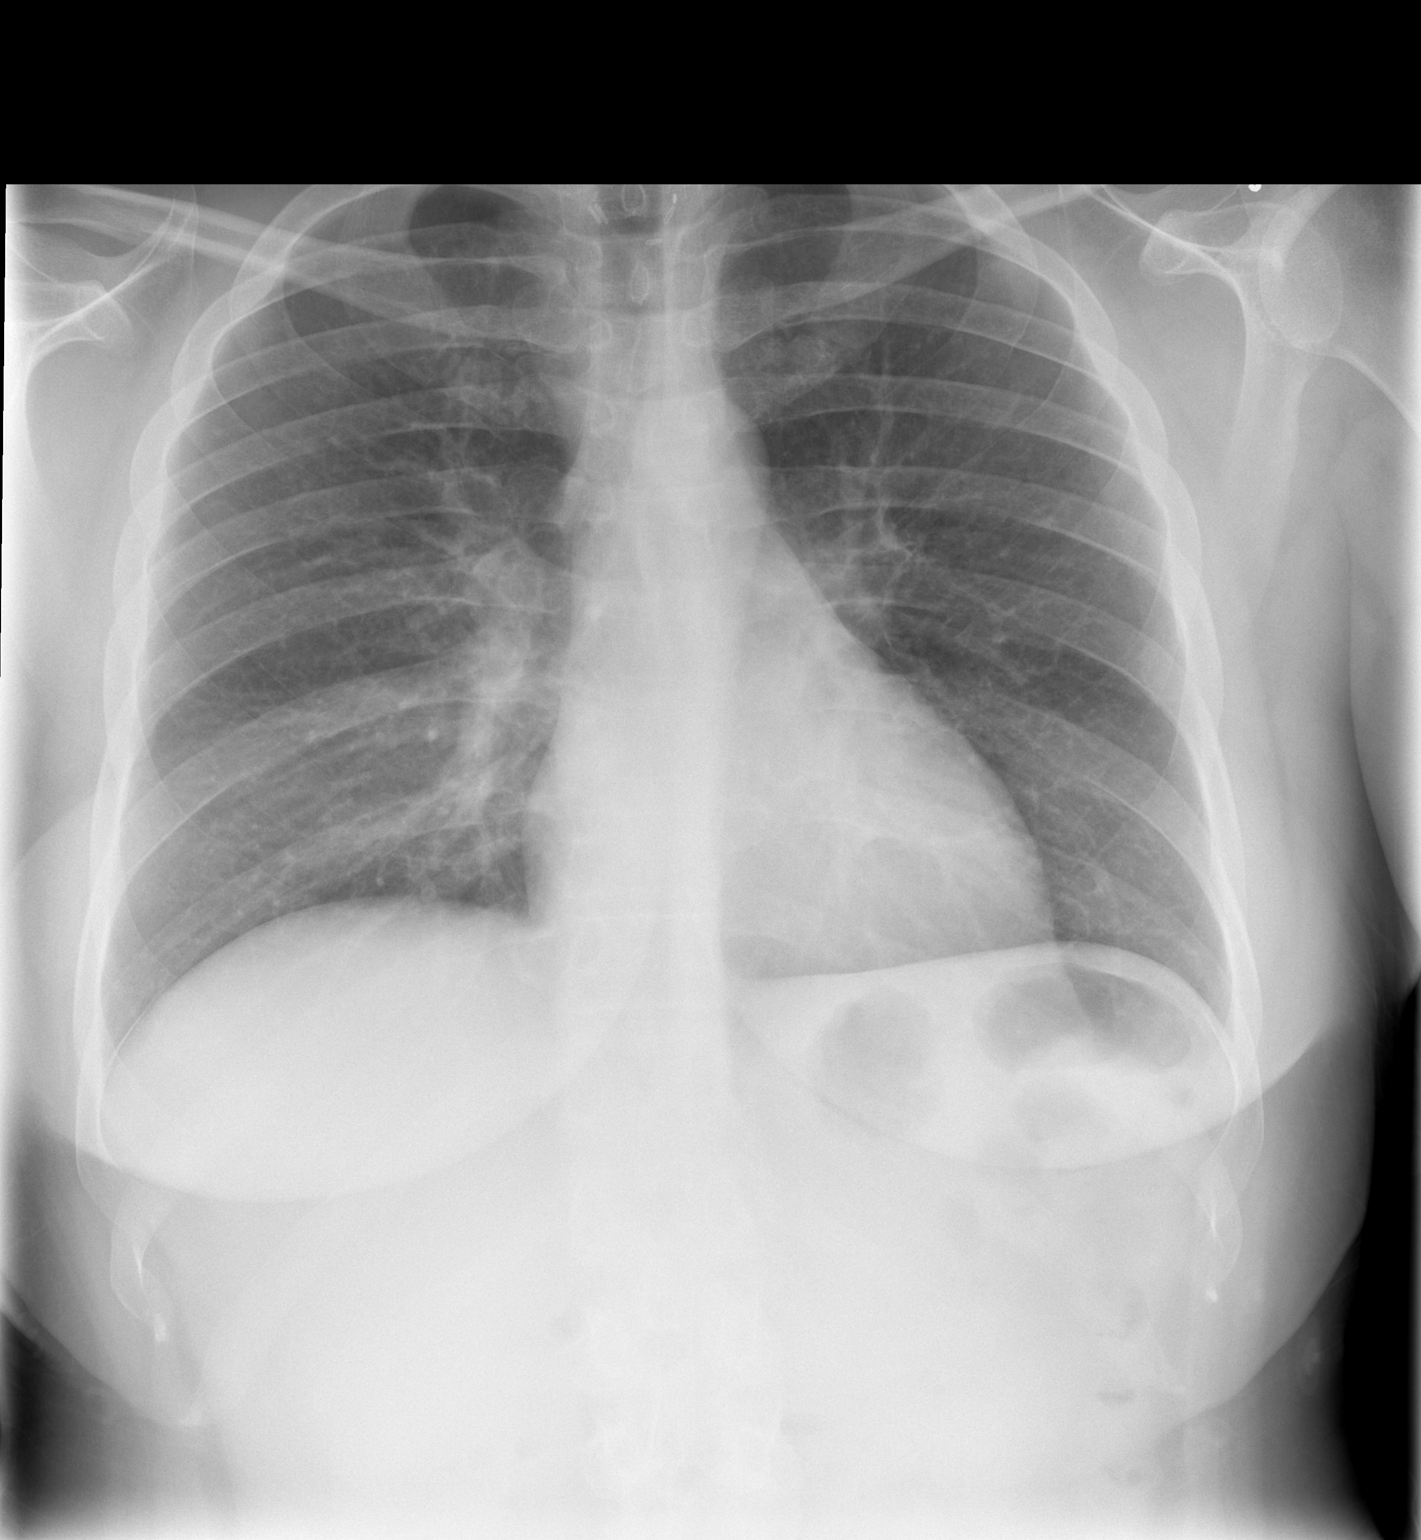

[w chest lat]
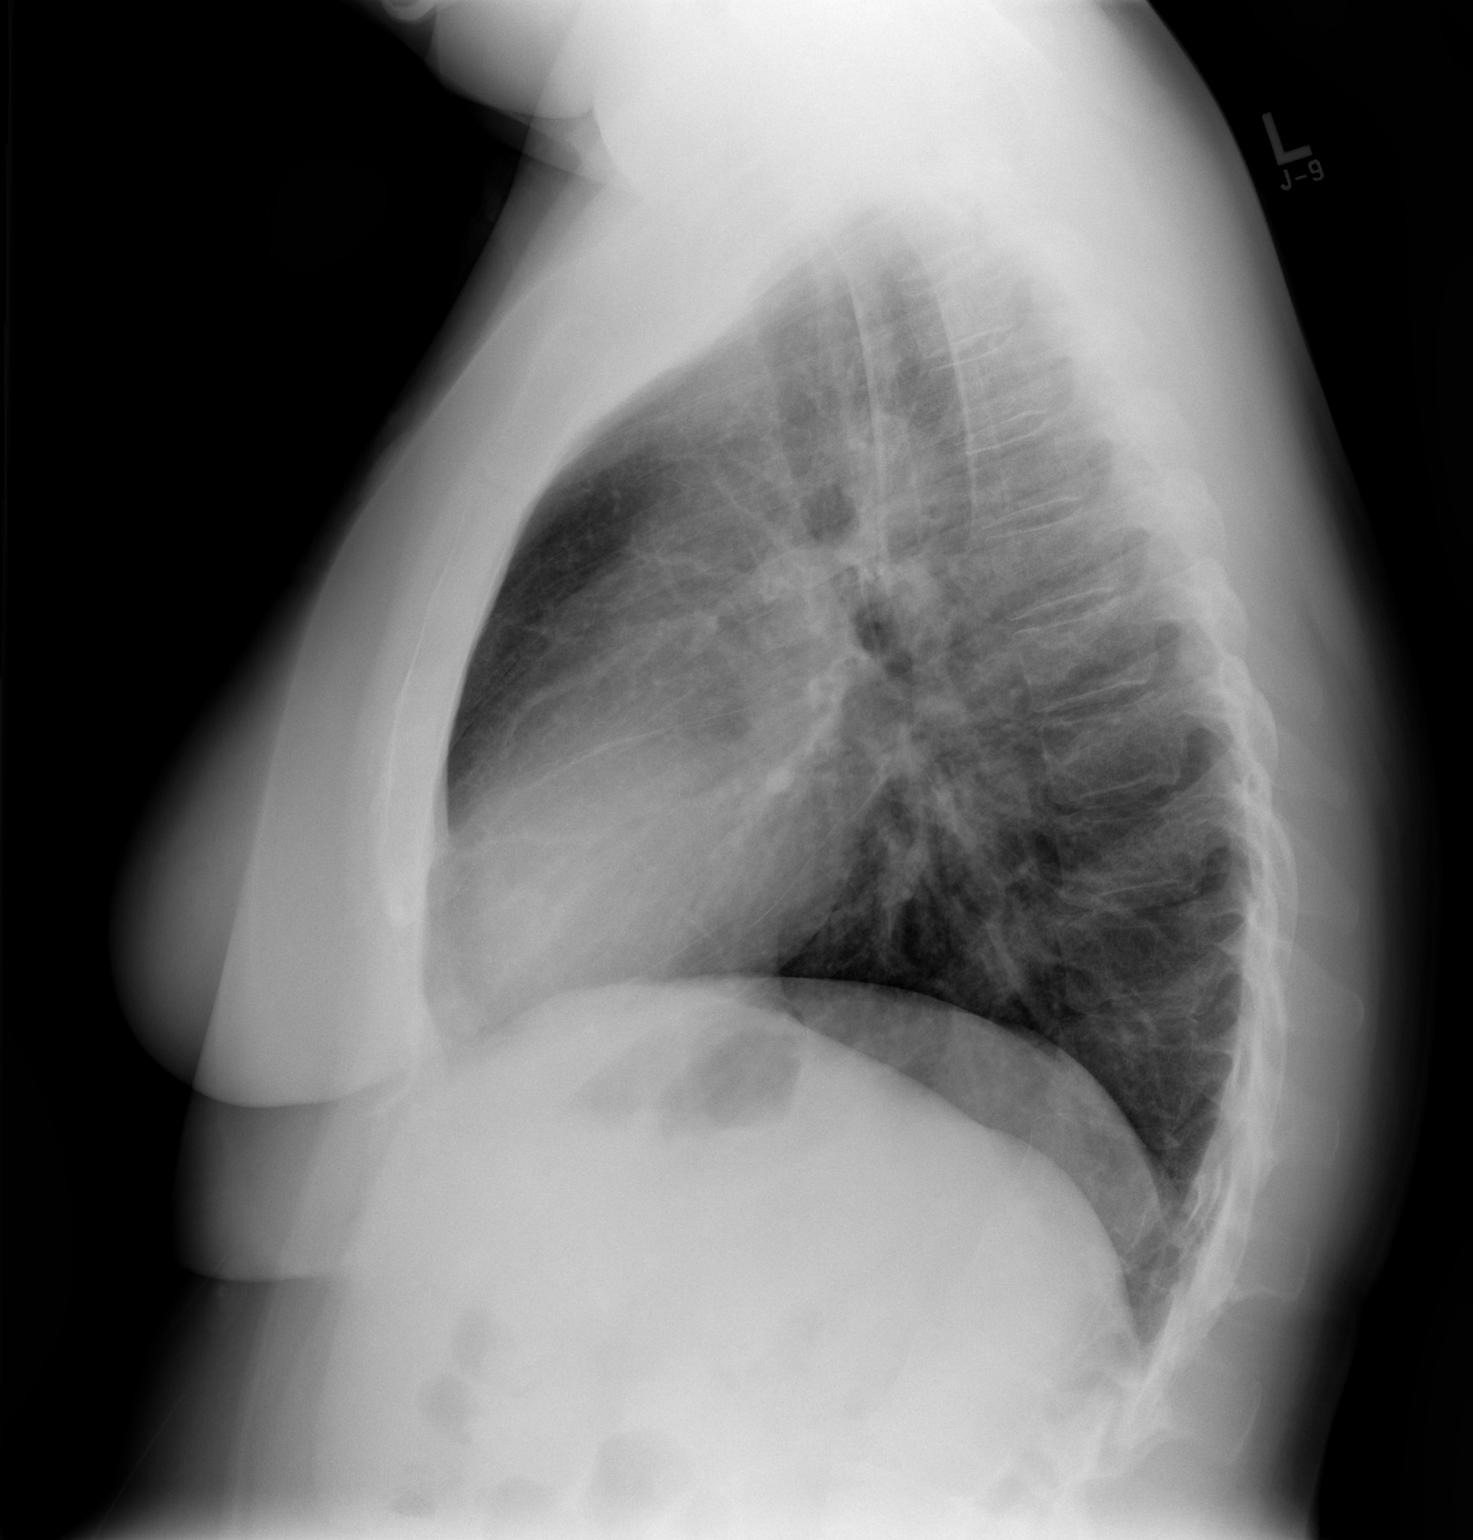

[2 of 2 positions shown; findings below may reference images not displayed]

FINDINGS: The lungs are adequately inflated. There is no focal infiltrate. The
heart and pulmonary vascularity within the limits of normal. The
mediastinum is normal in width. There is no pleural effusion. The
bony thorax is unremarkable.
IMPRESSION: There is no evidence of pneumonia. One cannot exclude acute
bronchitis in the appropriate clinical setting.

## 2015-11-04 ENCOUNTER — Other Ambulatory Visit: Payer: Self-pay | Admitting: Family Medicine

## 2015-11-04 MED ORDER — LEVOTHYROXINE SODIUM 175 MCG PO TABS: 175.0000 ug | ORAL_TABLET | Freq: Every day | ORAL | Status: DC

## 2015-11-05 ENCOUNTER — Ambulatory Visit: Payer: Medicaid Other | Admitting: Family Medicine

## 2015-12-11 ENCOUNTER — Other Ambulatory Visit: Payer: Self-pay | Admitting: Family Medicine

## 2015-12-11 MED ORDER — SYNTHROID 175 MCG PO TABS: ORAL_TABLET | ORAL | Status: DC

## 2015-12-11 NOTE — Telephone Encounter (Signed)
Patient requesting a refill on her thyroid medication, but want to have the generic Levothyroxine sent in instead of name brand.

## 2016-06-04 ENCOUNTER — Other Ambulatory Visit: Payer: Self-pay | Admitting: Family Medicine

## 2016-06-19 ENCOUNTER — Other Ambulatory Visit: Payer: Self-pay | Admitting: Family Medicine

## 2016-06-24 ENCOUNTER — Other Ambulatory Visit: Payer: Self-pay | Admitting: Family Medicine

## 2016-06-25 ENCOUNTER — Other Ambulatory Visit: Payer: Self-pay | Admitting: Family Medicine

## 2016-06-25 NOTE — Telephone Encounter (Signed)
Pt is calling because [facility] said that they have never received the prescription for her levothyroxine. [facility] says the page is blank and would like us to call them over the phone. jw

## 2016-06-25 NOTE — Telephone Encounter (Signed)
Medication called into pharmacy and left on their voicemail. Shylee Durrett,CMA

## 2016-08-23 ENCOUNTER — Ambulatory Visit: Payer: Medicaid Other | Admitting: Family Medicine

## 2016-10-20 ENCOUNTER — Encounter: Payer: Self-pay | Admitting: Student

## 2016-10-20 ENCOUNTER — Ambulatory Visit (INDEPENDENT_AMBULATORY_CARE_PROVIDER_SITE_OTHER): Payer: Self-pay | Admitting: Student

## 2016-10-20 ENCOUNTER — Other Ambulatory Visit (HOSPITAL_COMMUNITY)
Admission: RE | Admit: 2016-10-20 | Discharge: 2016-10-20 | Disposition: A | Discharge status: Home / Self Care | Payer: Medicaid Other | Source: Ambulatory Visit | Attending: Family Medicine | Admitting: Family Medicine

## 2016-10-20 VITALS — BP 104/66 | HR 83 | Temp 98.4°F | Wt 208.0 lb

## 2016-10-20 DIAGNOSIS — Z113 Encounter for screening for infections with a predominantly sexual mode of transmission: Secondary | ICD-10-CM | POA: Diagnosis present

## 2016-10-20 DIAGNOSIS — Z01419 Encounter for gynecological examination (general) (routine) without abnormal findings: Secondary | ICD-10-CM | POA: Diagnosis present

## 2016-10-20 DIAGNOSIS — Z8742 Personal history of other diseases of the female genital tract: Secondary | ICD-10-CM

## 2016-10-20 DIAGNOSIS — E890 Postprocedural hypothyroidism: Secondary | ICD-10-CM

## 2016-10-20 DIAGNOSIS — Z87898 Personal history of other specified conditions: Secondary | ICD-10-CM

## 2016-10-20 DIAGNOSIS — N898 Other specified noninflammatory disorders of vagina: Secondary | ICD-10-CM | POA: Insufficient documentation

## 2016-10-20 DIAGNOSIS — Z1151 Encounter for screening for human papillomavirus (HPV): Secondary | ICD-10-CM | POA: Diagnosis not present

## 2016-10-20 DIAGNOSIS — N939 Abnormal uterine and vaginal bleeding, unspecified: Secondary | ICD-10-CM

## 2016-10-20 LAB — CBC
HEMATOCRIT: 30.5 % — AB (ref 35.0–45.0)
HEMOGLOBIN: 8.9 g/dL — AB (ref 11.7–15.5)
MCH: 20.2 pg — ABNORMAL LOW (ref 27.0–33.0)
MCHC: 29.2 g/dL — AB (ref 32.0–36.0)
MCV: 69.3 fL — ABNORMAL LOW (ref 80.0–100.0)
MPV: 8.8 fL (ref 7.5–12.5)
Platelets: 546 10*3/uL — ABNORMAL HIGH (ref 140–400)
RBC: 4.4 MIL/uL (ref 3.80–5.10)
RDW: 18.7 % — AB (ref 11.0–15.0)
WBC: 8.8 10*3/uL (ref 3.8–10.8)

## 2016-10-20 LAB — TSH: TSH: 0.19 m[IU]/L — AB

## 2016-10-20 LAB — POCT WET PREP (WET MOUNT)
CLUE CELLS WET PREP WHIFF POC: NEGATIVE
Trichomonas Wet Prep HPF POC: ABSENT

## 2016-10-20 MED ORDER — METRONIDAZOLE 500 MG PO TABS: 500.0000 mg | ORAL_TABLET | Freq: Two times a day (BID) | ORAL | 0 refills | Status: DC

## 2016-10-20 NOTE — Assessment & Plan Note (Addendum)
Likley a sequelae of menstrual bleeding,  - will obtain wet prep--> showed BV will treat with flagyl -GC/CT as requested by pateint, she declines blood STD testing

## 2016-10-20 NOTE — Assessment & Plan Note (Signed)
Current symptoms may be associated with abnormal thyroid hormone  - TSH today

## 2016-10-20 NOTE — Patient Instructions (Signed)
Follow up with PCP as needed If your period does not stop within the next week, make an appointment to be seen by your PCP You will be called or any abnormal results If you have any questions or concerns, call the office at [phone]

## 2016-10-20 NOTE — Assessment & Plan Note (Signed)
Vaginal bleeding is consistent with menses with history of 4 pad use per day overall reassuring her other symptoms of dizziness and fatigue may be due to blood loss versus thyroid pathology. She is hemodynamically stable in clinic today - CBC today - TSH today

## 2016-10-20 NOTE — Progress Notes (Addendum)
   Subjective:    Patient ID: Katie Obrien, female    DOB: 1975/11/01, 41 y.o.   MRN: [id number]   CC: vaginal odor, bleeding  HPI: 41 y/o F presents for vaginal odor, bleeding  Vaginal bleeding with odor - feels she is having a prolonged period - period started on 1/10 ten stopped on 1/17 for one day and restarted on 1/18 - since then she feels her bleeding is heavy soaking up to 4 pads per day - reports some headaches, fatigue and dizziness associated, no LOC - denies SOB, chest pain - she is sexually active with one partner, dos not use condoms, reports having a BTL - denies vaginal irritation or dysuria, she does have cramping associated with her vaginal bleeding  - she does feel her bleeding is improving] - she has had menses in past that has been similar to her current one  Hypothyroidism s/p thyroidectomy - reports compliance with synthroid 175 - last TSH in 06/2015 was 1/4  Smoking status reviewed  Review of Systems  Per HPI, else denies recent illness, fever, N/V/D    Objective:  BP 104/66   Pulse 83   Temp 98.4 F (36.9 C) (Oral)   Wt 208 lb (94.3 kg)   LMP 10/06/2016   SpO2 99%   BMI 29.01 kg/m  Vitals and nursing note reviewed  General: NAD Cardiac: RRR, Respiratory: CTAB, normal effort Extremities: no edema or cyanosis.  Skin: warm and dry, no rashes noted Neuro: alert and oriented  Pelvic: Normal EGBUS, normal vaginal canal with moderate amount of blood, normal cervix with no CMT, normal mobile uterus, normal adnexa with no masses, no adnexal tenderness     Assessment & Plan:    Hypothyroidism, postop Current symptoms may be associated with abnormal thyroid hormone  - TSH today  Menorrhagia Vaginal bleeding is consistent with menses with history of 4 pad use per day overall reassuring her other symptoms of dizziness and fatigue may be due to blood loss versus thyroid pathology. She is hemodynamically stable in clinic today - CBC  today - TSH today  Vaginal odor Likley a sequelae of menstrual bleeding,  - will obtain wet prep--> showed BV will treat with flagyl -GC/CT as requested by pateint, she declines blood STD testing    Jaivon Vanbeek A. Kennon RoundsHaney MD, MS Family Medicine Resident PGY-3 Pager [phone]

## 2016-10-21 LAB — CERVICOVAGINAL ANCILLARY ONLY
CHLAMYDIA, DNA PROBE: NEGATIVE
NEISSERIA GONORRHEA: NEGATIVE

## 2016-10-22 ENCOUNTER — Encounter: Payer: Self-pay | Admitting: Student

## 2016-10-22 ENCOUNTER — Telehealth: Payer: Self-pay | Admitting: Student

## 2016-10-22 LAB — CYTOLOGY - PAP
Diagnosis: NEGATIVE
HPV: NOT DETECTED

## 2016-10-22 NOTE — Telephone Encounter (Signed)
Pt was seen on wed. She doesn't have time to take off from work to schedule another appt.  She would like for dr just to adjust her medication .  Please advise

## 2016-10-22 NOTE — Telephone Encounter (Signed)
Patient called regarding her lab results. Her TSH is low at 0.19 and she has mild anemia to a Hgb of 8.9. She did not answer but a message was left asking her to schedule an appointment with her PCP to discuss her results further. A letter was sent to her

## 2016-10-25 ENCOUNTER — Telehealth: Payer: Self-pay | Admitting: Student

## 2016-10-25 ENCOUNTER — Encounter: Payer: Self-pay | Admitting: *Deleted

## 2016-10-25 NOTE — Telephone Encounter (Signed)
Letter mailed to patient. [name] Hartsell,CMA  

## 2016-10-25 NOTE — Telephone Encounter (Signed)
Please call the pateint and let her know that her pap smear was normal and her HPV was negative

## 2016-10-27 ENCOUNTER — Other Ambulatory Visit: Payer: Self-pay | Admitting: *Deleted

## 2016-10-27 NOTE — Telephone Encounter (Signed)
Patient stated she is out of medication. She does not want to come into clinic for an appointment.  Advised patient that her PCP has not met her in person and requested she schedule an appointment.  Advised patient again that PCP will not adjust any medication without meeting with patient first.  Refill request sent to PCP.  Derl Barrow, RN

## 2016-10-28 MED ORDER — LEVOTHYROXINE SODIUM 175 MCG PO TABS: 175.0000 ug | ORAL_TABLET | Freq: Every day | ORAL | 1 refills | Status: DC

## 2016-10-28 NOTE — Telephone Encounter (Signed)
First available appointment with PCP is not until Feb. 23rd, pt states she can't wait that long for a refill. Pt would like PCP to call her. [phone].ep

## 2016-10-29 ENCOUNTER — Telehealth: Payer: Self-pay | Admitting: Student

## 2016-10-29 MED ORDER — LEVOTHYROXINE SODIUM 150 MCG PO TABS: 150.0000 ug | ORAL_TABLET | Freq: Every day | ORAL | 1 refills | Status: DC

## 2016-10-29 NOTE — Telephone Encounter (Signed)
Patient called to inform her that her synthroid will be reduced to 150 from 175 given recent TSH to 0.19. Will repeat TSH in 3 months

## 2016-10-29 NOTE — Telephone Encounter (Signed)
Can you adjust her medication until pt can be seen by her PCP (Diallo)

## 2016-11-02 ENCOUNTER — Encounter: Payer: Self-pay | Admitting: Family Medicine

## 2016-11-02 ENCOUNTER — Ambulatory Visit (INDEPENDENT_AMBULATORY_CARE_PROVIDER_SITE_OTHER): Payer: Self-pay | Admitting: Family Medicine

## 2016-11-02 VITALS — BP 110/64 | HR 92 | Temp 98.0°F | Wt 210.6 lb

## 2016-11-02 DIAGNOSIS — N939 Abnormal uterine and vaginal bleeding, unspecified: Secondary | ICD-10-CM

## 2016-11-02 DIAGNOSIS — N921 Excessive and frequent menstruation with irregular cycle: Secondary | ICD-10-CM

## 2016-11-02 LAB — POCT URINALYSIS DIPSTICK
Bilirubin, UA: NEGATIVE
Glucose, UA: NEGATIVE
Ketones, UA: NEGATIVE
Leukocytes, UA: NEGATIVE
NITRITE UA: NEGATIVE
PH UA: 7
PROTEIN UA: NEGATIVE
SPEC GRAV UA: 1.025
UROBILINOGEN UA: 0.2

## 2016-11-02 LAB — IRON AND TIBC
%SAT: 3 % — ABNORMAL LOW (ref 11–50)
IRON: 13 ug/dL — AB (ref 40–190)
TIBC: 404 ug/dL (ref 250–450)
UIBC: 391 ug/dL (ref 125–400)

## 2016-11-02 LAB — FERRITIN: FERRITIN: 3 ng/mL — AB (ref 10–154)

## 2016-11-02 LAB — POCT HEMOGLOBIN: Hemoglobin: 9.1 g/dL — AB (ref 12.2–16.2)

## 2016-11-02 LAB — POCT URINE PREGNANCY: PREG TEST UR: NEGATIVE

## 2016-11-02 MED ORDER — MEDROXYPROGESTERONE ACETATE 150 MG/ML IM SUSP
150.0000 mg | Freq: Once | INTRAMUSCULAR | Status: AC
  Administered 2016-11-02: 150 mg via INTRAMUSCULAR

## 2016-11-02 MED ORDER — NORGESTIMATE-ETH ESTRADIOL 0.25-35 MG-MCG PO TABS: 1.0000 | ORAL_TABLET | Freq: Every day | ORAL | 11 refills | Status: DC

## 2016-11-02 MED ORDER — FERROUS SULFATE 325 (65 FE) MG PO TBEC: 325.0000 mg | DELAYED_RELEASE_TABLET | Freq: Three times a day (TID) | ORAL | 3 refills | Status: DC

## 2016-11-02 NOTE — Progress Notes (Signed)
    Subjective:  Katie Obrien is a 41 y.o. female [facility] with a chief complaint of vaginal bleeding.   HPI:  Vaginal Bleeding Patient with worsening bleeding over the last couple weeks. Reports that she has been constantly bleeding for the last 4 weeks. She is currently using a tampon and pad at the same time due to the volume of bleeding. She is not taking any sort of supplements. She reports feeling tired, weak, and with low energy. Occasionally gets light headed with standing. Patient is concerned that she may have fibroids.   ROS: Per HPI  PMH: Smoking history reviewed.   Objective:  Physical Exam: BP 110/64   Pulse 92   Temp 98 F (36.7 C) (Oral)   Wt 210 lb 9.6 oz (95.5 kg)   LMP 10/06/2016   SpO2 97%   BMI 29.37 kg/m   Gen: NAD, resting comfortably CV: RRR with no murmurs appreciated Pulm: NWOB, CTAB with no crackles, wheezes, or rhonchi GI: Normal bowel sounds present. Soft, Nontender, Nondistended. GU: Deferred - no abnormalities on exam 2 weeks ago.  MSK: no edema, cyanosis, or clubbing noted Skin: warm, dry Neuro: grossly normal, moves all extremities Psych: Normal affect and thought content  Results for orders placed or performed in visit on 11/02/16 (from the past 72 hour(s))  Hemoglobin     Status: Abnormal   Collection Time: 11/02/16  3:53 PM  Result Value Ref Range   Hemoglobin 9.1 (A) 12.2 - 16.2 g/dL  POCT urine pregnancy     Status: None   Collection Time: 11/02/16  4:19 PM  Result Value Ref Range   Preg Test, Ur Negative Negative   Assessment/Plan:  Menorrhagia Likely related to thyroid issues. Hemodynamically stable. Hgb stable at 9.1 today. OCPs are contraindicated given that she smokes and is over age 40. Patient is concerned that she may have fibroids. -Will give dose of depo-provera in office today -Will obtain ultrasound to rule out fibroid or other structural abnormality.   -Will start iron supplementation.    -Will check full CBC and iron panel.  -Follow up in 4-6 weeks for repeat blood work, sooner if bleeding does not improve.   Katina Degreealeb M. Jimmey RalphParker, MD [facility] Resident PGY-3 11/02/2016 4:22 PM

## 2016-11-02 NOTE — Patient Instructions (Addendum)
We will start you on oral iron today. Please take three times a day on an empty stomach if tolerated.  We will give you a shot of depo today.   We will set you up to have an ultrasound.  We will check your iron levels.  Come back in 4-6 weeks to recheck your iron levels.  Take care,  Dr Jimmey RalphParker

## 2016-11-02 NOTE — Assessment & Plan Note (Signed)
Likely related to thyroid issues. Hemodynamically stable. Hgb stable at 9.1 today. OCPs are contraindicated given that she smokes and is over age 41. Patient is concerned that she may have fibroids. -Will give dose of depo-provera in office today -Will obtain ultrasound to rule out fibroid or other structural abnormality.   -Will start iron supplementation.  -Will check full CBC and iron panel.  -Follow up in 4-6 weeks for repeat blood work, sooner if bleeding does not improve.

## 2016-11-03 LAB — CBC
HCT: 29.2 % — ABNORMAL LOW (ref 35.0–45.0)
Hemoglobin: 8.5 g/dL — ABNORMAL LOW (ref 11.7–15.5)
MCH: 19.7 pg — AB (ref 27.0–33.0)
MCHC: 29.1 g/dL — AB (ref 32.0–36.0)
MCV: 67.7 fL — ABNORMAL LOW (ref 80.0–100.0)
MPV: 9.3 fL (ref 7.5–12.5)
PLATELETS: 497 10*3/uL — AB (ref 140–400)
RBC: 4.31 MIL/uL (ref 3.80–5.10)
RDW: 18.7 % — AB (ref 11.0–15.0)
WBC: 9.7 10*3/uL (ref 3.8–10.8)

## 2016-11-05 ENCOUNTER — Ambulatory Visit (HOSPITAL_COMMUNITY)
Admission: RE | Admit: 2016-11-05 | Discharge: 2016-11-05 | Disposition: A | Discharge status: Home / Self Care | Payer: Medicaid Other | Source: Ambulatory Visit | Attending: Family Medicine | Admitting: Family Medicine

## 2016-11-05 ENCOUNTER — Telehealth: Payer: Self-pay | Admitting: Family Medicine

## 2016-11-05 DIAGNOSIS — R188 Other ascites: Secondary | ICD-10-CM | POA: Insufficient documentation

## 2016-11-05 DIAGNOSIS — N939 Abnormal uterine and vaginal bleeding, unspecified: Secondary | ICD-10-CM | POA: Insufficient documentation

## 2016-11-05 NOTE — Telephone Encounter (Signed)
Called patient to discuss results. Significant for iron deficiency. Continue PO iron. Recheck in 1-2 months. Patient voiced understanding and had no further questions.  Katina Degreealeb M. Jimmey RalphParker, MD [facility] Resident PGY-3 11/05/2016 11:54 AM

## 2016-11-08 ENCOUNTER — Telehealth: Payer: Self-pay | Admitting: Family Medicine

## 2016-11-08 NOTE — Telephone Encounter (Signed)
Called patient to discuss ultrasound. No significant findings to explain her bleeding. She is feeling about the same as last week. She is still taking her iron supplementation. Asked patient to schedule a follow up appointment in 4-6 weeks for recheck, or sooner if symptoms worsen.  Katina Degreealeb M. Jimmey RalphParker, MD [facility] Resident PGY-3 11/08/2016 4:06 PM

## 2016-11-16 NOTE — Telephone Encounter (Signed)
Pt states the situation is much worse. Pt would like Dr. Jimmey RalphParker to call her. [phone] ep

## 2016-11-16 NOTE — Telephone Encounter (Signed)
Will forward to MD. Quanasia Defino,CMA  

## 2016-11-17 NOTE — Telephone Encounter (Signed)
Attempted to call patient. No answer. Left VM. If patient is having significantly more bleeding, she needs to come in to the office as soon as possible for re-evaluation and to recheck her blood counts.  Katina Degreealeb M. Jimmey RalphParker, MD [facility] Resident PGY-3 11/17/2016 12:26 PM

## 2016-11-18 NOTE — Telephone Encounter (Signed)
LM for patient to call back.  When she does please schedule an appointment for her to be reevaluated for this increased bleeding per her last call.  Thanks [employer] BrandsJazmin Jesseka Obrien,CMA

## 2016-12-02 ENCOUNTER — Ambulatory Visit: Payer: Medicaid Other | Admitting: Family Medicine

## 2016-12-03 ENCOUNTER — Encounter: Payer: Self-pay | Admitting: Obstetrics and Gynecology

## 2016-12-03 ENCOUNTER — Ambulatory Visit (INDEPENDENT_AMBULATORY_CARE_PROVIDER_SITE_OTHER): Payer: Self-pay | Admitting: Obstetrics and Gynecology

## 2016-12-03 VITALS — BP 130/88 | HR 50 | Temp 98.0°F | Wt 203.0 lb

## 2016-12-03 DIAGNOSIS — N921 Excessive and frequent menstruation with irregular cycle: Secondary | ICD-10-CM

## 2016-12-03 LAB — POCT HEMOGLOBIN: HEMOGLOBIN: 10 g/dL — AB (ref 12.2–16.2)

## 2016-12-03 MED ORDER — MEDROXYPROGESTERONE ACETATE 10 MG PO TABS: 10.0000 mg | ORAL_TABLET | Freq: Every day | ORAL | 0 refills | Status: DC

## 2016-12-03 NOTE — Patient Instructions (Signed)
Gyn referral placed Take medicine for 10 days   Dysfunctional Uterine Bleeding Dysfunctional uterine bleeding is abnormal bleeding from the uterus. Dysfunctional uterine bleeding includes:  A period that comes earlier or later than usual.  A period that is lighter, heavier, or has blood clots.  Bleeding between periods.  Skipping one or more periods.  Bleeding after sexual intercourse.  Bleeding after menopause. Follow these instructions at home: Pay attention to any changes in your symptoms. Follow these instructions to help with your condition: Eating and drinking   Eat well-balanced meals. Include foods that are high in iron, such as liver, meat, shellfish, green leafy vegetables, and eggs.  If you become constipated:  Drink plenty of water.  Eat fruits and vegetables that are high in water and fiber, such as spinach, carrots, raspberries, apples, and mango. Medicines   Take over-the-counter and prescription medicines only as told by your health care provider.  Do not change medicines without talking with your health care provider.  Aspirin or medicines that contain aspirin may make the bleeding worse. Do not take those medicines:  During the week before your period.  During your period.  If you were prescribed iron pills, take them as told by your health care provider. Iron pills help to replace iron that your body loses because of this condition. Activity   If you need to change your sanitary pad or tampon more than one time every 2 hours:  Lie in bed with your feet raised (elevated).  Place a cold pack on your lower abdomen.  Rest as much as possible until the bleeding stops or slows down.  Do not try to lose weight until the bleeding has stopped and your blood iron level is back to normal. Other Instructions   For two months, write down:  When your period starts.  When your period ends.  When any abnormal bleeding occurs.  What problems you  notice.  Keep all follow up visits as told by your health care provider. This is important. Contact a health care provider if:  You get light-headed or weak.  You have nausea and vomiting.  You cannot eat or drink without vomiting.  You feel dizzy or have diarrhea while you are taking medicines.  You are taking birth control pills or hormones, and you want to change them or stop taking them. Get help right away if:  You develop a fever or chills.  You need to change your sanitary pad or tampon more than one time per hour.  Your bleeding becomes heavier, or your flow contains clots more often.  You develop pain in your abdomen.  You lose consciousness.  You develop a rash. This information is not intended to replace advice given to you by your health care provider. Make sure you discuss any questions you have with your health care provider. Document Released: 09/10/2000 Document Revised: 02/19/2016 Document Reviewed: 12/09/2014 Elsevier Interactive Patient Education  2017 [employer]

## 2016-12-03 NOTE — Progress Notes (Signed)
   Subjective:   Patient ID: Katie Obrien, female    DOB: 1976-05-07, 41 y.o.   MRN: [id number]  Patient presents for Same Day Appointment  Chief Complaint  Patient presents with  . Menorrhagia    HPI: #AUB Patient states that she has been bleeding since January 10. States that she has not had any improvement and plan. [facility]eline and [facility]/2016. Patient at that time was given L injection. States that her bleeding continues to be heavy. She is currently using tampons and pads at the same time due to heavy bleeding. States that she can go through a box in 2 days. She is concerned that her hemoglobin is low. Endorsing symptoms of fatigue and weakness.  Prior to this current episode of bleeding patient with known medical history of menorrhagia. She states that her periods would last 9-10 days.  Medical history significant for thyroidectomy. Patient has been unable to control her thyroid levels over the last 5 years.  Review of Systems   See HPI for ROS.   History  Smoking Status  . Current Every Day Smoker  . Packs/day: 0.50  . Last attempt to quit: 07/11/2015  Smokeless Tobacco  . Never Used    Past medical history, surgical, family, and social history reviewed and updated in the EMR as appropriate.  Pertinent Historical Findings include: Anemia Objective:  BP 130/88   Pulse (!) 50   Temp 98 F (36.7 C) (Oral)   Wt 203 lb (92.1 kg)   LMP 12/03/2016   BMI 28.31 kg/m  Vitals and nursing note reviewed  Physical Exam Gen: NAD, resting comfortably CV: RRR with no murmurs appreciated Pulm: NWOB, CTAB with no crackles, wheezes, or rhonchi GI: Normal bowel sounds present. Soft, Nontender, Nondistended. Skin: warm, dry Neuro: grossly normal, moves all extremities Psych: Normal affect and thought content   Results for orders placed or performed in visit on 12/03/16 (from the past 24 hour(s))  POCT hemoglobin     Status: Abnormal   Collection Time:  12/03/16  9:57 AM  Result Value Ref Range   Hemoglobin 10.0 (A) 12.2 - 16.2 g/dL    Assessment & Plan:  Please see separate assessment and plan  Diagnosis and plan along with any newly prescribed medication(s) were discussed in detail with this patient today. The patient verbalized understanding and agreed with the plan. Patient advised if symptoms worsen return to clinic or ER.   Meds ordered this encounter  Medications  . medroxyPROGESTERone (PROVERA) 10 MG tablet    Sig: Take 1 tablet (10 mg total) by mouth daily.    Dispense:  10 tablet    Refill:  0   Orders Placed This Encounter  Procedures  . Ambulatory referral to Gynecology    Referral Priority:   Routine    Referral Type:   Consultation    Referral Reason:   Specialty Services Required    Requested Specialty:   Gynecology    Number of Visits Requested:   1  . POCT hemoglobin    PATIENT EDUCATION PROVIDED: See AVS   Caryl AdaJazma Jamisen Hawes, DO 12/03/2016, 9:58 AM PGY-3, [facility]

## 2016-12-06 NOTE — Assessment & Plan Note (Signed)
Continues to have heavy bleeding despite Depo-Provera injection given in clinic. From reviewing patient's records she is unable to tolerate OCPs due to smoking and age, has tried IUD but had to be removed due to hemorrhaging. Reviewed previous workup including ultrasound that did not show any fibroids as a cause for patient's bleeding. Has anemia from chronic heavy bleeding area and hemoglobin stable today 10. Is taking iron supplementation. Patient given Rx for Provera take by mouth for the next 10 days to help with DUB. Referral also placed to gynecology as I believe this patient would be a great candidate for ablation therapy. Patient to follow-up with PCP within the next 4 weeks or sooner as needed.

## 2017-02-02 ENCOUNTER — Ambulatory Visit: Payer: Medicaid Other | Admitting: Family Medicine

## 2017-11-21 ENCOUNTER — Other Ambulatory Visit: Payer: Self-pay | Admitting: Family Medicine

## 2017-11-21 ENCOUNTER — Telehealth: Payer: Self-pay | Admitting: Family Medicine

## 2017-11-21 DIAGNOSIS — D500 Iron deficiency anemia secondary to blood loss (chronic): Secondary | ICD-10-CM

## 2017-11-21 DIAGNOSIS — Z131 Encounter for screening for diabetes mellitus: Secondary | ICD-10-CM

## 2017-11-21 DIAGNOSIS — Z13220 Encounter for screening for lipoid disorders: Secondary | ICD-10-CM

## 2017-11-21 DIAGNOSIS — N921 Excessive and frequent menstruation with irregular cycle: Secondary | ICD-10-CM

## 2017-11-21 DIAGNOSIS — E890 Postprocedural hypothyroidism: Secondary | ICD-10-CM

## 2017-11-21 MED ORDER — LEVOTHYROXINE SODIUM 150 MCG PO TABS: 150.0000 ug | ORAL_TABLET | Freq: Every day | ORAL | 1 refills | Status: DC

## 2017-11-21 NOTE — Telephone Encounter (Signed)
Will forward to MD to advise. [name] Hartsell,CMA  

## 2017-11-21 NOTE — Telephone Encounter (Signed)
Pt needs refill on her levothyroxine. She's been out for 2 weeks. Please advise

## 2017-11-21 NOTE — Telephone Encounter (Signed)
Medication refilled. [name] Hartsell,CMA

## 2017-11-21 NOTE — Telephone Encounter (Signed)
[name],  Could we verify the pharmacy that patient is using.   Thanks  Lovena NeighboursAbdoulaye Meredyth Hornung, MD [facility], PGY-2

## 2017-11-21 NOTE — Telephone Encounter (Signed)
Pharmacy confirmed with patient. [name] Hartsell,CMA

## 2017-11-25 ENCOUNTER — Other Ambulatory Visit: Payer: Self-pay

## 2017-11-25 DIAGNOSIS — E890 Postprocedural hypothyroidism: Secondary | ICD-10-CM

## 2017-11-25 DIAGNOSIS — D500 Iron deficiency anemia secondary to blood loss (chronic): Secondary | ICD-10-CM

## 2017-11-25 DIAGNOSIS — N921 Excessive and frequent menstruation with irregular cycle: Secondary | ICD-10-CM

## 2017-11-25 DIAGNOSIS — Z131 Encounter for screening for diabetes mellitus: Secondary | ICD-10-CM

## 2017-11-25 DIAGNOSIS — Z13220 Encounter for screening for lipoid disorders: Secondary | ICD-10-CM

## 2017-11-26 LAB — HEMOGLOBIN A1C
ESTIMATED AVERAGE GLUCOSE: 105 mg/dL
Hgb A1c MFr Bld: 5.3 % (ref 4.8–5.6)

## 2017-11-28 ENCOUNTER — Ambulatory Visit (INDEPENDENT_AMBULATORY_CARE_PROVIDER_SITE_OTHER): Payer: Self-pay | Admitting: Family Medicine

## 2017-11-28 ENCOUNTER — Encounter: Payer: Self-pay | Admitting: Family Medicine

## 2017-11-28 ENCOUNTER — Other Ambulatory Visit: Payer: Self-pay

## 2017-11-28 VITALS — BP 120/80 | HR 55 | Temp 98.0°F | Wt 221.0 lb

## 2017-11-28 DIAGNOSIS — E890 Postprocedural hypothyroidism: Secondary | ICD-10-CM

## 2017-11-28 DIAGNOSIS — E785 Hyperlipidemia, unspecified: Secondary | ICD-10-CM

## 2017-11-28 DIAGNOSIS — D500 Iron deficiency anemia secondary to blood loss (chronic): Secondary | ICD-10-CM

## 2017-11-28 LAB — LIPID PANEL
Chol/HDL Ratio: 5 ratio — ABNORMAL HIGH (ref 0.0–4.4)
Cholesterol, Total: 220 mg/dL — ABNORMAL HIGH (ref 100–199)
HDL: 44 mg/dL (ref >39)
LDL Calculated: 161 mg/dL — ABNORMAL HIGH (ref 0–99)
Triglycerides: 75 mg/dL (ref 0–149)
VLDL Cholesterol Cal: 15 mg/dL (ref 5–40)

## 2017-11-28 LAB — CMP14+EGFR
ALBUMIN: 4.7 g/dL (ref 3.5–5.5)
ALK PHOS: 35 IU/L — AB (ref 39–117)
ALT: 11 IU/L (ref 0–32)
AST: 20 IU/L (ref 0–40)
Albumin/Globulin Ratio: 1.6 (ref 1.2–2.2)
BUN / CREAT RATIO: 13 (ref 9–23)
BUN: 11 mg/dL (ref 6–24)
CHLORIDE: 102 mmol/L (ref 96–106)
CO2: 22 mmol/L (ref 20–29)
CREATININE: 0.86 mg/dL (ref 0.57–1.00)
Calcium: 8.8 mg/dL (ref 8.7–10.2)
GFR calc non Af Amer: 85 mL/min/{1.73_m2} (ref >59)
GFR, EST AFRICAN AMERICAN: 98 mL/min/{1.73_m2} (ref >59)
GLUCOSE: 82 mg/dL (ref 65–99)
Globulin, Total: 2.9 g/dL (ref 1.5–4.5)
Potassium: 4.4 mmol/L (ref 3.5–5.2)
Sodium: 140 mmol/L (ref 134–144)
TOTAL PROTEIN: 7.6 g/dL (ref 6.0–8.5)

## 2017-11-28 LAB — CBC WITH DIFFERENTIAL/PLATELET
BASOS: 1 %
Basophils Absolute: 0.1 10*3/uL (ref 0.0–0.2)
EOS (ABSOLUTE): 0.2 10*3/uL (ref 0.0–0.4)
EOS: 2 %
Hemoglobin: 10.8 g/dL — ABNORMAL LOW (ref 11.1–15.9)
IMMATURE GRANS (ABS): 0 10*3/uL (ref 0.0–0.1)
Immature Granulocytes: 0 %
LYMPHS: 31 %
Lymphocytes Absolute: 2.7 10*3/uL (ref 0.7–3.1)
MCH: 25.3 pg — AB (ref 26.6–33.0)
MCHC: 32.6 g/dL (ref 31.5–35.7)
MCV: 78 fL — ABNORMAL LOW (ref 79–97)
MONOCYTES: 7 %
Monocytes Absolute: 0.6 10*3/uL (ref 0.1–0.9)
NEUTROS ABS: 5.2 10*3/uL (ref 1.4–7.0)
NEUTROS PCT: 59 %
Platelets: 423 10*3/uL — ABNORMAL HIGH (ref 150–379)
RBC: 4.27 x10E6/uL (ref 3.77–5.28)
RDW: 17 % — ABNORMAL HIGH (ref 12.3–15.4)
WBC: 8.8 10*3/uL (ref 3.4–10.8)

## 2017-11-28 LAB — ANEMIA PANEL
Ferritin: 7 ng/mL — ABNORMAL LOW (ref 15–150)
Folate, Hemolysate: 278.3 ng/mL
Folate, RBC: 841 ng/mL (ref >498)
HEMATOCRIT: 33.1 % — AB (ref 34.0–46.6)
IRON SATURATION: 8 % — AB (ref 15–55)
IRON: 33 ug/dL (ref 27–159)
Retic Ct Pct: 1.5 % (ref 0.6–2.6)
TIBC: 391 ug/dL (ref 250–450)
UIBC: 358 ug/dL (ref 131–425)
Vitamin B-12: 1341 pg/mL — ABNORMAL HIGH (ref 232–1245)

## 2017-11-28 LAB — TSH: TSH: 36.56 u[IU]/mL — ABNORMAL HIGH (ref 0.450–4.500)

## 2017-11-28 MED ORDER — ATORVASTATIN CALCIUM 20 MG PO TABS: 20.0000 mg | ORAL_TABLET | Freq: Every day | ORAL | 0 refills | Status: DC

## 2017-11-28 NOTE — Patient Instructions (Signed)
It was great seeing you today! We have addressed the following issues today  1. We will check a T3 and T4 and follow up on the results 2. Start taking your iron pill 2 times a day, take a stool softener. 3. I wil start you on low dose cholesterol medication. Please work on exercise regimen.   If we did any lab work today, and the results require attention, either me or my nurse will get in touch with you. If everything is normal, you will get a letter in mail and a message via . If you don't hear from us in two weeks, please give us a call. Otherwise, we look forward to seeing you again at your next visit. If you have any questions or concerns before then, please call the clinic at [phone].  Please bring all your medications to every doctors visit  Sign up for My Chart to have easy access to your labs results, and communication with your Primary care physician. Please ask Front Desk for some assistance.   Please check-out at the front desk before leaving the clinic.    Take Care,   Dr. Sydnee Cabaliallo

## 2017-11-29 DIAGNOSIS — E785 Hyperlipidemia, unspecified: Secondary | ICD-10-CM | POA: Insufficient documentation

## 2017-11-29 LAB — T4, FREE: Free T4: 0.84 ng/dL (ref 0.82–1.77)

## 2017-11-29 LAB — T3, FREE: T3 FREE: 1.6 pg/mL — AB (ref 2.0–4.4)

## 2017-11-29 NOTE — Assessment & Plan Note (Signed)
Hemoglobin is 10.8, ferritin of 7 and a saturation of 3%.  Results consistent for deficiency anemia.  Patient is currently on iron therapy 325 mg to 3 times daily.  Patient reports not taking supplementation due to intermittent nausea.  Discussed with patient need to resume ferrous sulfate twice daily as well as bowel regimen to avoid constipation.  Patient is in agreement.  Will recheck hemoglobin in 2 months when she comes to clinic.  Will recheck CBC in a month at next office visit.

## 2017-11-29 NOTE — Assessment & Plan Note (Signed)
TSH today was elevated at 36.560.  Patient is currently on 50 mcg of thyroxine daily and endorses compliance to current regimen.  Given elevated TSH status post thyroidectomy will order T4 and T3 for further characterization.

## 2017-11-29 NOTE — Assessment & Plan Note (Signed)
Patient with elevated cholesterol at 220 and an elevated LDL  greater than 160.  Could be secondary to uncontrolled hypothyroidism.  Given age and risk factors will start patient on low-dose statin follow-up results. --Prescribe atorvastatin 20 mg daily will titrate accordingly

## 2017-11-29 NOTE — Progress Notes (Signed)
   Subjective:    Patient ID: Waynetta Peanrystal G FritzDoughty, female    DOB: Oct 31, 1975, 42 y.o.   MRN: [id number]   CC: Follow-up for hypothyroidism and recent lab results  HPI: Patient is a 42 year old female with a past medical history significant for Graves' disease status post thyroidectomy, hypertension, hypothyroidism, migraine who presented here today to discuss recent results from lab work.  Patient reports that she has been seen by a doctor in a few months because she self patient does not have insurance.  Patient currently has complaints of fatigue.  Patient was previously anemic and was started on iron supplementation, but has denied taking iron pills for the past few months.  Patient reports being adherent to her levothyroxine dosage.  Patient denies any physical activity/exercise.  Patient currently denies any chest pain, shortness of breath, lipid, nausea, vomiting, headache, dizziness.  Smoking status reviewed   ROS: all other systems were reviewed and are negative other than in the HPI   Past Medical History:  Diagnosis Date  . Grave's disease   . Graves disease   . Hypertension   . Hyperthyroidism   . Migraine     Past Surgical History:  Procedure Laterality Date  . CESAREAN SECTION  1998, 2001  . TONSILLECTOMY AND ADENOIDECTOMY  1993  . TOTAL THYROIDECTOMY  07/31/11  . TUBAL LIGATION      Past medical history, surgical, family, and social history reviewed and updated in the EMR as appropriate.  Objective:  BP 120/80   Pulse (!) 55   Temp 98 F (36.7 C) (Oral)   Wt 221 lb (100.2 kg)   LMP 11/25/2017   SpO2 90%   BMI 30.82 kg/m   Vitals and nursing note reviewed  General: NAD, pleasant, able to participate in exam Cardiac: RRR, normal heart sounds, no murmurs. 2+ radial and PT pulses bilaterally Respiratory: CTAB, normal effort, No wheezes, rales or rhonchi Abdomen: soft, nontender, nondistended, no hepatic or splenomegaly, +BS Extremities: no edema or  cyanosis. WWP. Skin: warm and dry, no rashes noted Neuro: alert and oriented x4, no focal deficits Psych: Normal affect and mood   Assessment & Plan:    Hypothyroidism, postop TSH today was elevated at 36.560.  Patient is currently on 50 mcg of thyroxine daily and endorses compliance to current regimen.  Given elevated TSH status post thyroidectomy will order T4 and T3 for further characterization.  Anemia Hemoglobin is 10.8, ferritin of 7 and a saturation of 3%.  Results consistent for deficiency anemia.  Patient is currently on iron therapy 325 mg to 3 times daily.  Patient reports not taking supplementation due to intermittent nausea.  Discussed with patient need to resume ferrous sulfate twice daily as well as bowel regimen to avoid constipation.  Patient is in agreement.  Will recheck hemoglobin in 2 months when she comes to clinic.  Will recheck CBC in a month at next office visit.  Hyperlipidemia Patient with elevated cholesterol at 220 and an elevated LDL  greater than 160.  Could be secondary to uncontrolled hypothyroidism.  Given age and risk factors will start patient on low-dose statin follow-up results. --Prescribe atorvastatin 20 mg daily will titrate accordingly    Lovena NeighboursAbdoulaye Akshat Minehart, MD [facility] PGY-2

## 2017-12-30 ENCOUNTER — Ambulatory Visit: Payer: Medicaid Other | Admitting: Family Medicine

## 2018-04-04 ENCOUNTER — Other Ambulatory Visit: Payer: Self-pay | Admitting: Family Medicine

## 2018-04-05 ENCOUNTER — Other Ambulatory Visit: Payer: Self-pay

## 2018-04-05 DIAGNOSIS — E890 Postprocedural hypothyroidism: Secondary | ICD-10-CM

## 2018-04-06 LAB — T4, FREE: FREE T4: 0.85 ng/dL (ref 0.82–1.77)

## 2018-04-06 LAB — TSH: TSH: 33.71 u[IU]/mL — ABNORMAL HIGH (ref 0.450–4.500)

## 2018-05-01 ENCOUNTER — Ambulatory Visit: Payer: Medicaid Other | Admitting: Family Medicine

## 2018-07-06 ENCOUNTER — Other Ambulatory Visit: Payer: Self-pay | Admitting: Family Medicine

## 2018-09-14 ENCOUNTER — Other Ambulatory Visit: Payer: Self-pay | Admitting: Family Medicine

## 2018-10-20 ENCOUNTER — Other Ambulatory Visit: Payer: Self-pay | Admitting: Family Medicine

## 2018-11-26 ENCOUNTER — Other Ambulatory Visit: Payer: Self-pay | Admitting: Family Medicine

## 2018-12-20 ENCOUNTER — Other Ambulatory Visit: Payer: Self-pay | Admitting: Family Medicine

## 2019-01-03 ENCOUNTER — Telehealth (INDEPENDENT_AMBULATORY_CARE_PROVIDER_SITE_OTHER): Payer: Self-pay | Admitting: Family Medicine

## 2019-01-03 DIAGNOSIS — N10 Acute tubulo-interstitial nephritis: Secondary | ICD-10-CM

## 2019-01-03 HISTORY — DX: Acute pyelonephritis: N10

## 2019-01-03 NOTE — Assessment & Plan Note (Signed)
Clinical suspicion with n/v, UTI symptoms, and flank pain with associated fever. Failed outpatient management.  - Instructed to return to ED for IV abx and further workup, patient in agreement and will go now - Reviewed return precations

## 2019-01-03 NOTE — Telephone Encounter (Signed)
[facility] Telemedicine Visit  Patient consented to have visit conducted via telephone.  Encounter participants: Patient: Katie Obrien  Provider: Wendee Beavers   Chief Complaint: vomiting and UTI  HPI:  Patient was seen at [facility] 6 days ago for UTI (encounter not visible on EMR). She underwent CT without signs of pyelonephritis but was discharged with Keflex 500 mg QID. She has been unable to take the medications d/t n/v and reports fevers and chills about 24 hours ago (no resolved). She doe have flank pain on the right and continues to have dysuria.   ROS: See HPI.  Pertinent PMHx: no significant history. Reviewed.  Exam:  Unable to obtain given telemed visit. Talking is complete sentences.  Assessment/Plan:  Acute pyelonephritis Clinical suspicion with n/v, UTI symptoms, and flank pain with associated fever. Failed outpatient management.  - Instructed to return to ED for IV abx and further workup, patient in agreement and will go now - Reviewed return precations    Time spent on phone with patient: 15 minutes

## 2019-02-14 ENCOUNTER — Other Ambulatory Visit: Payer: Self-pay | Admitting: Family Medicine

## 2019-02-18 ENCOUNTER — Other Ambulatory Visit: Payer: Self-pay | Admitting: Family Medicine

## 2019-04-18 ENCOUNTER — Other Ambulatory Visit: Payer: Self-pay

## 2019-04-18 ENCOUNTER — Ambulatory Visit (INDEPENDENT_AMBULATORY_CARE_PROVIDER_SITE_OTHER): Payer: Self-pay | Admitting: Family Medicine

## 2019-04-18 ENCOUNTER — Encounter: Payer: Self-pay | Admitting: Family Medicine

## 2019-04-18 VITALS — BP 124/72 | HR 85 | Wt 217.2 lb

## 2019-04-18 DIAGNOSIS — E890 Postprocedural hypothyroidism: Secondary | ICD-10-CM

## 2019-04-18 DIAGNOSIS — F319 Bipolar disorder, unspecified: Secondary | ICD-10-CM

## 2019-04-18 MED ORDER — QUETIAPINE FUMARATE 25 MG PO TABS: 25.0000 mg | ORAL_TABLET | Freq: Every day | ORAL | 0 refills | Status: AC

## 2019-04-18 MED ORDER — LEVOTHYROXINE SODIUM 150 MCG PO TABS: 150.0000 ug | ORAL_TABLET | Freq: Every day | ORAL | 0 refills | Status: DC

## 2019-04-18 NOTE — Progress Notes (Signed)
    Subjective:  Katie Obrien is a 43 y.o. female who presents to the [facility] today with a chief complaint of follow-up on thyroid and anxiety.   HPI:  Follow-up on thyroid Patient is 7 years status post surgical thyroidectomy due to goiter.  She has been on Synthroid 150 mcg daily.  Last TSH was in the 30s drawn in 2019.  Patient reports that she is nonadherent to taking it daily.  Says that she has great fluctuation in symptoms everywhere from anxiety, palpitations, to lethargic feeling.  Seems to be more driven by mood than actual thyroid dysfunction.  Anxiety/depression/concern for bipolar disorder Patient saying that she has extremely stressed due to multiple things going on her life including the death of ex-husband, death of her father, recent marriage that is in crises.  Scored very high on both gad 7 and PHQ 9 in the 20s.  Scoring positive for bipolar 1 on MDI.  Has been on SSRIs and SNRIs in the past and Wellbutrin.  Has had fluctuating success on these.  Also reports "manic episodes" where she will be up for days without sleeping.  Reports racing thoughts and difficulty sleeping right now.  Also has a family history of bipolar 1 disorder.  Has had multiple hospitalizations for psychiatric illness and attempted suicide.    ROS: Per HPI  PMH: Smoking history reviewed.    Objective:  Physical Exam: BP 124/72   Pulse 85   Wt 217 lb 3.2 oz (98.5 kg)   LMP 03/26/2019 (Approximate)   SpO2 99%   BMI 30.29 kg/m   Gen: NAD, resting comfortably CV: RRR with no murmurs appreciated Pulm: NWOB, CTAB with no crackles, wheezes, or rhonchi GI: Normal bowel sounds present. Soft, Nontender, Nondistended. MSK: no edema, cyanosis, or clubbing noted Skin: warm, dry Neuro: grossly normal, moves all extremities   No results found for this or any previous visit (from the past 72 hour(s)).   Assessment/Plan:  Hypothyroidism, postop Been greater than 1 year since last check.  We will get  thyroid stimulating hormone and T4.  Plan to refill patient's Synthroid.  Will adjust based on these lab values.  Bipolar 1 disorder ([place]) New diagnosis.  Has history of manic episodes.  Feels that she matches the description on MDI very well.  Will likely need a mood stabilizer/antipsychotic to manage symptoms.  Start low-dose Seroquel 25 mg nightly for 1 week, then increase to 50 mg as tolerated.  Patient follow-up in 1 month to reassess mood.  Will need PHQ 9 and gad 7 at that time.     Lab Orders     TSH     T4, Free  Meds ordered this encounter  Medications  . QUEtiapine (SEROQUEL) 25 MG tablet    Sig: Take 1 tablet (25 mg total) by mouth at bedtime. Take 1 pill at night for 1 week. If tolerating well, may increase to 2 pills a night.    Dispense:  90 tablet    Refill:  0  . levothyroxine (EUTHYROX) 150 MCG tablet    Sig: Take 1 tablet (150 mcg total) by mouth daily before breakfast.    Dispense:  30 tablet    Refill:  0      Marny Lowenstein, MD, MS FAMILY MEDICINE RESIDENT - PGY2 04/18/2019 4:35 PM

## 2019-04-18 NOTE — Assessment & Plan Note (Signed)
Been greater than 1 year since last check.  We will get thyroid stimulating hormone and T4.  Plan to refill patient's Synthroid.  Will adjust based on these lab values.

## 2019-04-18 NOTE — Patient Instructions (Signed)
Bipolar 1 Disorder Bipolar 1 disorder is a mental health disorder in which a person has episodes of emotional highs (mania), and may also have episodes of emotional lows (depression) in addition to highs. Bipolar 1 disorder is different from other bipolar disorders because it involves extreme manic episodes. These episodes last at least one week or involve symptoms that are so severe that hospitalization is needed to keep the person safe. What increases the risk? The cause of this condition is not known. However, certain factors make you more likely to have bipolar disorder, such as:  Having a family member with the disorder.  An imbalance of certain chemicals in the brain (neurotransmitters).  Stress, such as illness, financial problems, or a death.  Certain conditions that affect the brain or spinal cord (neurologic conditions).  Brain injury (trauma).  Having another mental health disorder, such as: ? Obsessive compulsive disorder. ? Schizophrenia. What are the signs or symptoms? Symptoms of mania include:  Very high self-esteem or self-confidence.  Decreased need for sleep.  Unusual talkativeness or feeling a need to keep talking. Speech may be very fast. It may seem like you cannot stop talking.  Racing thoughts or constant talking, with quick shifts between topics that may or may not be related (flight of ideas).  Decreased ability to focus or concentrate.  Increased purposeful activity, such as work, studies, or social activity.  Increased nonproductive activity. This could be pacing, squirming and fidgeting, or finger and toe tapping.  Impulsive behavior and poor judgment. This may result in high-risk activities, such as having unprotected sex or spending a lot of money. Symptoms of depression include:  Feeling sad, hopeless, or helpless.  Frequent or uncontrollable crying.  Lack of feeling or caring about anything.  Sleeping too much.  Moving more slowly than  usual.  Not being able to enjoy things you used to enjoy.  Wanting to be alone all the time.  Feeling guilty or worthless.  Lack of energy or motivation.  Trouble concentrating or remembering.  Trouble making decisions.  Increased appetite.  Thoughts of death, or the desire to harm yourself. Sometimes, you may have a mixed mood. This means having symptoms of depression and mania. Stress can make symptoms worse. How is this diagnosed? To diagnose bipolar disorder, your health care provider may ask about your:  Emotional episodes.  Medical history.  Alcohol and drug use. This includes prescription medicines. Certain medical conditions and substances can cause symptoms that seem like bipolar disorder (secondary bipolar disorder). How is this treated? Bipolar disorder is a long-term (chronic) illness. It is best controlled with ongoing (continuous) treatment rather than treatment only when symptoms occur. Treatment may include:  Medicine. Medicine can be prescribed by a provider who specializes in treating mental disorders (psychiatrist). ? Medicines called mood stabilizers are usually prescribed. ? If symptoms occur even while taking a mood stabilizer, other medicines may be added.  Psychotherapy. Some forms of talk therapy, such as cognitive-behavioral therapy (CBT), can provide support, education, and guidance.  Coping methods, such as journaling or relaxation exercises. These may include: ? Yoga. ? Meditation. ? Deep breathing.  Lifestyle changes, such as: ? Limiting alcohol and drug use. ? Exercising regularly. ? Getting plenty of sleep. ? Making healthy eating choices.  A combination of medicine, talk therapy, and coping methods is best. A procedure in which electricity is applied to the brain through the scalp (electroconvulsive therapy) may be used in cases of severe mania when medicine and psychotherapy work too   slowly or do not work. Follow these instructions at  home: Activity   Return to your normal activities as told by your health care provider.  Find activities that you enjoy, and make time to do them.  Exercise regularly as told by your health care provider. Lifestyle  Limit alcohol intake to no more than 1 drink a day for nonpregnant women and 2 drinks a day for men. One drink equals 12 oz of beer, 5 oz of wine, or 1 oz of hard liquor.  Follow a set schedule for eating and sleeping.  Eat a balanced diet that includes fresh fruits and vegetables, whole grains, low-fat dairy, and lean meat.  Get 7-8 hours of sleep each night. General instructions  Take over-the-counter and prescription medicines only as told by your health care provider.  Think about joining a support group. Your health care provider may be able to recommend a support group.  Talk with your family and loved ones about your treatment goals and how they can help.  Keep all follow-up visits as told by your health care provider. This is important. Where to find more information For more information about bipolar disorder, visit the following websites:  National Alliance on Mental Illness: www.nami.org  U.S. National Institute of Mental Health: www.nimh.nih.gov Contact a health care provider if:  Your symptoms get worse.  You have side effects from your medicine, and they get worse.  You have trouble sleeping.  You have trouble doing daily activities.  You feel unsafe in your surroundings.  You are dealing with substance abuse. Get help right away if:  You have new symptoms.  You have thoughts about harming yourself.  You self-harm. This information is not intended to replace advice given to you by your health care provider. Make sure you discuss any questions you have with your health care provider. Document Released: 12/20/2000 Document Revised: 08/26/2017 Document Reviewed: 05/13/2016 Elsevier Patient Education  2020 Elsevier Inc.  

## 2019-04-18 NOTE — Assessment & Plan Note (Signed)
New diagnosis.  Has history of manic episodes.  Feels that she matches the description on MDI very well.  Will likely need a mood stabilizer/antipsychotic to manage symptoms.  Start low-dose Seroquel 25 mg nightly for 1 week, then increase to 50 mg as tolerated.  Patient follow-up in 1 month to reassess mood.  Will need PHQ 9 and gad 7 at that time.

## 2019-04-19 LAB — T4, FREE: Free T4: 0.87 ng/dL (ref 0.82–1.77)

## 2019-04-19 LAB — TSH: TSH: 9.82 u[IU]/mL — ABNORMAL HIGH (ref 0.450–4.500)

## 2019-04-21 ENCOUNTER — Encounter: Payer: Self-pay | Admitting: Family Medicine

## 2019-04-30 ENCOUNTER — Telehealth: Payer: Self-pay

## 2019-04-30 MED ORDER — LEVOTHYROXINE SODIUM 150 MCG PO TABS: 150.0000 ug | ORAL_TABLET | ORAL | 3 refills | Status: DC

## 2019-04-30 NOTE — Telephone Encounter (Signed)
Patient calls nurse line stating the medication that was sent for thyroid is not the normal one she takes. Patient would like the generic Synthroid sent to pharmacy. Patient also stated she went to a hospital in [place] for neck swelling. Patient stated a mass of tissue was found in her neck. Patient would like to discuss further. Scheduled with PCP for next week.

## 2019-04-30 NOTE — Telephone Encounter (Signed)
Attempted to call patient.  Pharmacy will contact her when script is ready to be picked up. Torrance Frech,CMA

## 2019-04-30 NOTE — Telephone Encounter (Signed)
Sent in synthroid 150 as requested.

## 2019-05-09 ENCOUNTER — Ambulatory Visit: Payer: Self-pay | Admitting: Family Medicine

## 2019-08-15 ENCOUNTER — Other Ambulatory Visit: Payer: Self-pay | Admitting: *Deleted

## 2019-08-15 NOTE — Telephone Encounter (Signed)
Patient is requesting to be changed from levothyroxin to Euthyrox.  Lovetta Condie,CMA

## 2019-08-16 MED ORDER — LEVOTHYROXINE SODIUM 150 MCG PO TABS: 150.0000 ug | ORAL_TABLET | Freq: Every day | ORAL | 3 refills | Status: DC

## 2020-02-04 ENCOUNTER — Encounter (HOSPITAL_COMMUNITY): Payer: Self-pay | Admitting: Emergency Medicine

## 2020-02-04 ENCOUNTER — Other Ambulatory Visit: Payer: Self-pay

## 2020-02-04 ENCOUNTER — Emergency Department (HOSPITAL_COMMUNITY): Payer: Self-pay

## 2020-02-04 ENCOUNTER — Emergency Department (HOSPITAL_COMMUNITY)
Admission: EM | Admit: 2020-02-04 | Discharge: 2020-02-04 | Disposition: A | Discharge status: Home / Self Care | Payer: Self-pay | Attending: Emergency Medicine | Admitting: Emergency Medicine

## 2020-02-04 DIAGNOSIS — F17210 Nicotine dependence, cigarettes, uncomplicated: Secondary | ICD-10-CM | POA: Insufficient documentation

## 2020-02-04 DIAGNOSIS — I10 Essential (primary) hypertension: Secondary | ICD-10-CM | POA: Insufficient documentation

## 2020-02-04 DIAGNOSIS — E039 Hypothyroidism, unspecified: Secondary | ICD-10-CM | POA: Insufficient documentation

## 2020-02-04 DIAGNOSIS — Y999 Unspecified external cause status: Secondary | ICD-10-CM | POA: Insufficient documentation

## 2020-02-04 DIAGNOSIS — S92354A Nondisplaced fracture of fifth metatarsal bone, right foot, initial encounter for closed fracture: Secondary | ICD-10-CM | POA: Insufficient documentation

## 2020-02-04 DIAGNOSIS — S93401A Sprain of unspecified ligament of right ankle, initial encounter: Secondary | ICD-10-CM | POA: Insufficient documentation

## 2020-02-04 DIAGNOSIS — Z79899 Other long term (current) drug therapy: Secondary | ICD-10-CM | POA: Insufficient documentation

## 2020-02-04 DIAGNOSIS — W109XXA Fall (on) (from) unspecified stairs and steps, initial encounter: Secondary | ICD-10-CM | POA: Insufficient documentation

## 2020-02-04 DIAGNOSIS — Y929 Unspecified place or not applicable: Secondary | ICD-10-CM | POA: Insufficient documentation

## 2020-02-04 DIAGNOSIS — Y9389 Activity, other specified: Secondary | ICD-10-CM | POA: Insufficient documentation

## 2020-02-04 NOTE — ED Triage Notes (Signed)
Pt reports right foot pain after falling down two steps yesterday. Pt denies loc or hitting head. Pt reports able to bear weight but with increased pain. No obvious deformity noted.

## 2020-02-04 NOTE — Discharge Instructions (Addendum)
You were seen today for pain in your right ankle. Please read and follow all provided instructions.    The xray of your foot shows: mildly comminuted nondisplaced intra-articular fracture involving the fifth metatarsal base  The xray of your ankle does not show broken bones.   1. Medications: alternate ibuprofen and tylenol for pain control, usual home medications  2. Treatment: rest, ice, elevate and use brace, drink plenty of fluids, gentle stretching  3. Follow Up: Please followup with orthopedics as directed or your PCP in 1 week if no improvement for discussion of your diagnoses and further evaluation after today's visit; if you do not have a primary care doctor use the resource guide provided to find one; Please return to the ER for worsening symptoms or other concerns

## 2020-02-04 NOTE — ED Provider Notes (Signed)
[facility] EMERGENCY DEPARTMENT Provider Note   CSN: [id number] Arrival date & time: 02/04/20  0938     History Chief Complaint  Patient presents with  . Foot Pain    Katie Obrien is a 44 y.o. female with past medical history significant for Graves' disease, hypertension, anemia presents to emergency department today with chief complaint of progressively worsening right foot pain x2 days.  Patient states she was not paying attention while walking down the steps and missed the last 2 steps causing her to invert her ankle.  She states she heard a pop.  She has been able to walk and bear weight on her right leg but states it is very painful.  After the fall she moved her ankle around in all directions and states she heard a second pop.  She admits to throbbing pain in her right ankle that radiates to her forefoot.  She has no pain at rest and states pain is worse with movement.  She rates pain 6 of 10 in severity.  She took ibuprofen prior to arrival with symptom improvement.  She endorses history of right ankle injury in the distant past. She denies hitting her head or loss of consciousness.  Also denies numbness, tingling, decrease sensation in the right lower extremity. She is not anticoagulated.    Past Medical History:  Diagnosis Date  . Acute pyelonephritis 01/03/2019  . Anemia 01/08/2014  . Grave's disease   . Graves disease   . Hypertension   . Hyperthyroidism   . Migraine   . Migraine with status migrainosus 01/08/2014  . Strain of right trapezius muscle 02/21/2015    Patient Active Problem List   Diagnosis Date Noted  . Bipolar 1 disorder ([place]) 04/18/2019  . Hyperlipidemia 11/29/2017  . Vaginal odor 10/20/2016  . Sensation of fullness in both ears 02/21/2015  . Obesity (BMI 30-39.9) 02/02/2013  . Hypothyroidism, postop 11/23/2011  . Menorrhagia 06/28/2011  . TOBACCO USER 06/14/2009    Past Surgical History:  Procedure Laterality Date  . [place],  2001  . TONSILLECTOMY AND ADENOIDECTOMY  1993  . TOTAL THYROIDECTOMY  07/31/11  . TUBAL LIGATION       OB History   No obstetric history on file.     Family History  Problem Relation Age of Onset  . Cancer Maternal Aunt        ovarian  . Cancer Maternal Grandfather        bone    Social History   Tobacco Use  . Smoking status: Current Every Day Smoker    Packs/day: 0.50    Last attempt to quit: 07/11/2015    Years since quitting: 4.5  . Smokeless tobacco: Never Used  Substance Use Topics  . Alcohol use: Yes    Alcohol/week: 0.0 standard drinks  . Drug use: No    Home Medications Prior to Admission medications   Medication Sig Start Date End Date Taking? Authorizing Provider  levothyroxine (EUTHYROX) 150 MCG tablet Take 1 tablet (150 mcg total) by mouth daily before breakfast. 08/16/19  Yes Bonnita Hollow, MD  QUEtiapine (SEROQUEL) 25 MG tablet Take 1 tablet (25 mg total) by mouth at bedtime. Take 1 pill at night for 1 week. If tolerating well, may increase to 2 pills a night. 04/18/19 07/17/19  Bonnita Hollow, MD    Allergies    Morphine, Codeine, and Penicillins  Review of Systems   Review of Systems  All other systems are reviewed  and are negative for acute change except as noted in the HPI.   Physical Exam Updated Vital Signs BP (!) 149/83 (BP Location: Right Arm)   Pulse 97   Temp 98 F (36.7 C) (Oral)   Resp 18   Ht 5\' 11"  (1.803 m)   Wt 90.7 kg   LMP 02/01/2020   SpO2 100%   BMI 27.89 kg/m   Physical Exam Vitals and nursing note reviewed.  Constitutional:      Appearance: She is well-developed. She is not ill-appearing or toxic-appearing.  HENT:     Head: Normocephalic and atraumatic.     Nose: Nose normal.  Eyes:     General: No scleral icterus.       Right eye: No discharge.        Left eye: No discharge.     Conjunctiva/sclera: Conjunctivae normal.  Neck:     Vascular: No JVD.  Cardiovascular:     Rate and Rhythm: Normal rate  and regular rhythm.     Pulses: Normal pulses.          Dorsalis pedis pulses are 2+ on the right side and 2+ on the left side.     Heart sounds: Normal heart sounds.  Pulmonary:     Effort: Pulmonary effort is normal.     Breath sounds: Normal breath sounds.  Abdominal:     General: There is no distension.  Musculoskeletal:     Cervical back: Normal range of motion.     Comments: There is swelling and tenderness over the lateral malleolus.No overt deformity. No tenderness over the medial aspect of the ankle. The fifth metatarsal is not tender. Tenderness to palpation of right fore foot without swelling. The ankle joint is intact without excessive opening on stressing. No break in skin. Good pedal pulse and cap refill of all toes. Wiggling toes without difficulty.   Full ROM of bilateral hips and knees. Ambulates with mildly antalgic gait.  Skin:    General: Skin is warm and dry.  Neurological:     Mental Status: She is oriented to person, place, and time.     GCS: GCS eye subscore is 4. GCS verbal subscore is 5. GCS motor subscore is 6.     Comments: Fluent speech, no facial droop.  Psychiatric:        Behavior: Behavior normal.     ED Results / Procedures / Treatments   Labs (all labs ordered are listed, but only abnormal results are displayed) Labs Reviewed - No data to display  EKG None  Radiology DG Ankle Complete Right  Addendum Date: 02/04/2020   ADDENDUM REPORT: 02/04/2020 11:39 ADDENDUM: Mildly displaced fracture is present at the base of the fifth metatarsal. This is described on the foot radiographs of the same day. Electronically Signed   By: 04/05/2020 M.D.   On: 02/04/2020 11:39   Result Date: 02/04/2020 CLINICAL DATA:  Right ankle and foot pain. Fall on steps. Anterior and lateral ankle pain. EXAM: RIGHT ANKLE - COMPLETE 3+ VIEW COMPARISON:  None. FINDINGS: Soft tissue swelling is present over the lateral and anterior ankle joint. No underlying fracture  is present. Degenerative changes are noted in the hindfoot. IMPRESSION: 1. Soft tissue swelling without underlying fracture. 2. Degenerative changes in the hindfoot. Electronically Signed: By: 04/05/2020 M.D. On: 02/04/2020 09:55   DG Foot Complete Right  Result Date: 02/04/2020 CLINICAL DATA:  Lateral right foot pain and swelling after fall EXAM: RIGHT FOOT COMPLETE -  3+ VIEW COMPARISON:  Same-day ankle radiograph FINDINGS: Mildly comminuted nondisplaced intra-articular fracture involving the fifth metatarsal base. No additional fractures. Osseous alignment is maintained without dislocation. Mild arthropathy of the talonavicular joint. There is soft tissue swelling at the medial hindfoot. IMPRESSION: Mildly comminuted nondisplaced intra-articular fracture involving the fifth metatarsal base. Electronically Signed   By: Duanne Guess D.O.   On: 02/04/2020 11:40    Procedures Procedures (including critical care time)  Medications Ordered in ED Medications - No data to display  ED Course  I have reviewed the triage vital signs and the nursing notes.  Pertinent labs & imaging results that were available during my care of the patient were reviewed by me and considered in my medical decision making (see chart for details).    MDM Rules/Calculators/A&P                      Patient presents to the ED with complaints of pain to the right ankle s/p falling down 2 stairs with ankle inversion. Exam without obvious deformity or open wounds. ROM intact. Tender to palpation of right fore foot. NVI distally. Xray of right ankle viewed by me shows no fracture or dislocation. Xray of right foot shows mildly comminuted nondisplaced intra-articular fracture involving the fifth metatarsal base. Neurovascularly intact distally. Compartments soft above and below affected joint.  She has been taking ibuprofen at home for pain and swelling. She denies chance of pregnancy and politely declines pregnancy  test today. Patient given cam walker and crutches and advised not to bear weight on right foot. Patient denies need for prescription for analgesics, she prefers to take tylenol.  I assessed patient in a cam walker was applied and she is neurovascularly intact. The patient appears reasonably screened and/or stabilized for discharge and I doubt any other medical condition or other [facility] requiring further screening, evaluation, or treatment in the ED at this time prior to discharge. The patient is safe for discharge with strict return precautions discussed. Recommend orthopedics follow up. Findings and plan of care discussed with supervising physician Dr. Denton Lank.   Portions of this note were generated with Scientist, clinical (histocompatibility and immunogenetics). Dictation errors may occur despite best attempts at proofreading.   Final Clinical Impression(s) / ED Diagnoses Final diagnoses:  Sprain of right ankle, unspecified ligament, initial encounter  Closed nondisplaced fracture of fifth metatarsal bone of right foot, initial encounter    Rx / DC Orders ED Discharge Orders    None       Sherene Sires, PA-C 02/04/20 1735    Cathren Laine, MD 02/05/20 [phone]

## 2020-05-14 ENCOUNTER — Ambulatory Visit (INDEPENDENT_AMBULATORY_CARE_PROVIDER_SITE_OTHER): Payer: Self-pay | Admitting: Family Medicine

## 2020-05-14 ENCOUNTER — Other Ambulatory Visit: Payer: Self-pay

## 2020-05-14 ENCOUNTER — Encounter: Payer: Self-pay | Admitting: Family Medicine

## 2020-05-14 VITALS — BP 122/68 | HR 94 | Wt 229.0 lb

## 2020-05-14 DIAGNOSIS — Z114 Encounter for screening for human immunodeficiency virus [HIV]: Secondary | ICD-10-CM

## 2020-05-14 DIAGNOSIS — Z0000 Encounter for general adult medical examination without abnormal findings: Secondary | ICD-10-CM

## 2020-05-14 DIAGNOSIS — N921 Excessive and frequent menstruation with irregular cycle: Secondary | ICD-10-CM

## 2020-05-14 DIAGNOSIS — E890 Postprocedural hypothyroidism: Secondary | ICD-10-CM

## 2020-05-14 DIAGNOSIS — Z130 Encounter for screening for diseases of the blood and blood-forming organs and certain disorders involving the immune mechanism: Secondary | ICD-10-CM

## 2020-05-14 DIAGNOSIS — D500 Iron deficiency anemia secondary to blood loss (chronic): Secondary | ICD-10-CM

## 2020-05-14 DIAGNOSIS — Z1159 Encounter for screening for other viral diseases: Secondary | ICD-10-CM

## 2020-05-14 DIAGNOSIS — R42 Dizziness and giddiness: Secondary | ICD-10-CM

## 2020-05-14 MED ORDER — LEVOTHYROXINE SODIUM 150 MCG PO TABS: 150.0000 ug | ORAL_TABLET | ORAL | 3 refills | Status: DC

## 2020-05-14 NOTE — Patient Instructions (Addendum)
It was a pleasure to meet you!  1. I have given you a rx for levothyroxine, take it to whichever pharmacy you like. Please let me know if you have any trouble.  2. We got labs for anemia and your thyroid today, I will call you with results in a few days.  3. Follow up for a pap smear at your earliest convenience, 2-3 weeks  Be Well,  Dr. Leary Roca

## 2020-05-15 ENCOUNTER — Encounter: Payer: Self-pay | Admitting: Family Medicine

## 2020-05-15 DIAGNOSIS — R42 Dizziness and giddiness: Secondary | ICD-10-CM | POA: Insufficient documentation

## 2020-05-15 DIAGNOSIS — Z0000 Encounter for general adult medical examination without abnormal findings: Secondary | ICD-10-CM | POA: Insufficient documentation

## 2020-05-15 DIAGNOSIS — D509 Iron deficiency anemia, unspecified: Secondary | ICD-10-CM | POA: Insufficient documentation

## 2020-05-15 LAB — HIV ANTIBODY (ROUTINE TESTING W REFLEX): HIV Screen 4th Generation wRfx: NONREACTIVE

## 2020-05-15 LAB — HCV AB W REFLEX TO QUANT PCR: HCV Ab: 0.1 s/co ratio (ref 0.0–0.9)

## 2020-05-15 LAB — CBC WITH DIFFERENTIAL/PLATELET
Basophils Absolute: 0.1 10*3/uL (ref 0.0–0.2)
Basos: 1 %
EOS (ABSOLUTE): 0.1 10*3/uL (ref 0.0–0.4)
Eos: 2 %
Hematocrit: 27.8 % — ABNORMAL LOW (ref 34.0–46.6)
Hemoglobin: 8.2 g/dL — ABNORMAL LOW (ref 11.1–15.9)
Immature Grans (Abs): 0 10*3/uL (ref 0.0–0.1)
Immature Granulocytes: 0 %
Lymphocytes Absolute: 2.4 10*3/uL (ref 0.7–3.1)
Lymphs: 26 %
MCH: 20.4 pg — ABNORMAL LOW (ref 26.6–33.0)
MCHC: 29.5 g/dL — ABNORMAL LOW (ref 31.5–35.7)
MCV: 69 fL — ABNORMAL LOW (ref 79–97)
Monocytes Absolute: 0.6 10*3/uL (ref 0.1–0.9)
Monocytes: 7 %
Neutrophils Absolute: 5.9 10*3/uL (ref 1.4–7.0)
Neutrophils: 64 %
Platelets: 455 10*3/uL — ABNORMAL HIGH (ref 150–450)
RBC: 4.01 x10E6/uL (ref 3.77–5.28)
RDW: 18.6 % — ABNORMAL HIGH (ref 11.7–15.4)
WBC: 9.2 10*3/uL (ref 3.4–10.8)

## 2020-05-15 LAB — IRON,TIBC AND FERRITIN PANEL
Ferritin: 5 ng/mL — ABNORMAL LOW (ref 15–150)
Iron Saturation: 9 % — CL (ref 15–55)
Iron: 38 ug/dL (ref 27–159)
Total Iron Binding Capacity: 429 ug/dL (ref 250–450)
UIBC: 391 ug/dL (ref 131–425)

## 2020-05-15 LAB — TSH: TSH: 5.33 u[IU]/mL — ABNORMAL HIGH (ref 0.450–4.500)

## 2020-05-15 LAB — T4, FREE: Free T4: 1.11 ng/dL (ref 0.82–1.77)

## 2020-05-15 LAB — HCV INTERPRETATION

## 2020-05-15 NOTE — Progress Notes (Signed)
SUBJECTIVE:   CHIEF COMPLAINT / HPI: dizziness, wants to switch back to levothyroxine  Dizziness: patient reports that after she takes her euthyroxine in the morning she feels a little lightheaded, she then drives to work so she notices the lightheadedness when driving. This does not occur at other times of the day when driving, or if she does not take her pill. She was switched to euthyroxine last time by [facility], and noticed these symptoms at that time. She at first wondered if it was related to her eyes, saw her ophthalmologist and had updated lens prescription, but still experienced lightheadedness while driving after taking the pill in the morning. This did not happen with levothyroxine (synthroid) and she would like to switch back to see if that gets rid of the sensation. She denies dizziness or vertigo with changing positions, no change in sensation or movement, no falls or accidents, does not find it otherwise bothersome.  Patient reports that she is concerned that she has low iron and anemia again. She has had significant anemia and low iron in the past and has felt a little fatigued lately and wants to have this checked. She reports that she has heavy periods that are regular but can last 2 weeks at a time. She has not had any GI bleeding, no nausea, vomiting, diarrhea.  PERTINENT  PMH / PSH: hypothyroidism, anemia, menorrhagia  OBJECTIVE:   BP 122/68    Pulse 94    Wt 229 lb (103.9 kg)    LMP 04/13/2020    SpO2 99%    BMI 31.94 kg/m   Physical Exam Vitals and nursing note reviewed.  Constitutional:      General: She is not in acute distress.    Appearance: Normal appearance. She is obese. She is not ill-appearing, toxic-appearing or diaphoretic.  HENT:     Head: Normocephalic and atraumatic.  Cardiovascular:     Rate and Rhythm: Normal rate and regular rhythm.     Pulses: Normal pulses.     Heart sounds: Normal heart sounds. No murmur heard.  No friction rub. No  gallop.   Pulmonary:     Effort: Pulmonary effort is normal. No respiratory distress.     Breath sounds: Normal breath sounds. No wheezing, rhonchi or rales.  Abdominal:     General: Abdomen is flat. Bowel sounds are normal. There is no distension.     Palpations: Abdomen is soft.     Tenderness: There is no abdominal tenderness.  Skin:    General: Skin is warm and dry.  Neurological:     General: No focal deficit present.     Mental Status: She is alert. Mental status is at baseline.     Comments: Cranial Nerves: II: Visual Fields are full.PERRL. III,IV, VI: EOMI without ptosis or diplopia.  V: Facial sensation is symmetric to touch VII: Facial movement is symmetric.  VIII: hearing is intact to voice X: Palate elevates symmetrically XI: Shoulder shrug is symmetric. XII: tongue is midline without atrophy or fasciculations.  Motor: Tone is normal. Bulk is normal. 5/5 strength was present in all four extremities.  Psychiatric:        Mood and Affect: Mood normal.        Behavior: Behavior normal.    ASSESSMENT/PLAN:   Hypothyroidism, postop It is possible that patient's mild dizziness is due to pill since it only occurs with euthryoid pill intake, will prescribe syntrhoid (levothyroxine) instead. F/u 2-3 weeks to assess if improved. -  Check TSH, fT4 today, will adjust medication if necessary based on these values  Iron deficiency anemia Ptient with h/o anemia, now hgb low to 8.2, iron low to 7. Patient taking iron supplementation, recently started. Chronic blood loss most likely due to menorrhagia, please see that problem. Most likely cause for fatigue. - continue daily fe supplementation - orange juice, green leafy vegetables - consider IV iron infusion  Menorrhagia Patient has long h/o menorrhagia, reports continued 2 week periods. Has tried IUD, Depo-provera w/o improvement. Not a candidate to OCPs due to smoking and age. Had pelvic US in 2018 that revealed no structural  abnormalities. Has significant anemia to 8.2, Fe 7 due to heavy bleeding. Is on iron supplementation. Will consider megace to stop bleeding at next period. She has previously been referred to gynecology before, but did not go. Will discuss with patient as ablation therapy will likely be very helpful for her. Follow up in 2-3 weeks to discuss menorrhagia and perform pap smear, which is due.  Dizziness Self-limited to mornings when taking euthyroid, has not caused accidents, no red flags. Dizziness could also be due to severe anemia, treating with iron supplementation, will discuss with patient possibility/interest in IV infusions. Reasonable to try switching back to synthroid and assessing if this resolves the problem. Recommend she try to take pill at different time than before driving to work to prevent any side effects from occurring while driving. Will follow up in 2-3 weeks to ensure improvement. Cautioned that if worsens, has episode of LOC, syncope, new speech or motor problems, to call us and go to emergency room.     Shirlean Mylar, MD [facility]

## 2020-05-15 NOTE — Assessment & Plan Note (Signed)
-   Obtained Hep C screen today, low risk - Discussed returning for pap smear in 2-3 weeks and to discuss menorrhagia further, pap smear is due

## 2020-05-15 NOTE — Assessment & Plan Note (Addendum)
It is possible that patient's mild dizziness is due to pill since it only occurs with euthryoid pill intake, will prescribe syntrhoid (levothyroxine) instead. F/u 2-3 weeks to assess if improved. - Check TSH, fT4 today, will adjust medication if necessary based on these values

## 2020-05-15 NOTE — Assessment & Plan Note (Signed)
Patient has long h/o menorrhagia, reports continued 2 week periods. Has tried IUD, Depo-provera w/o improvement. Not a candidate to OCPs due to smoking and age. Had pelvic US in 2018 that revealed no structural abnormalities. Has significant anemia to 8.2, Fe 7 due to heavy bleeding. Is on iron supplementation. Will consider megace to stop bleeding at next period. She has previously been referred to gynecology before, but did not go. Will discuss with patient as ablation therapy will likely be very helpful for her. Follow up in 2-3 weeks to discuss menorrhagia and perform pap smear, which is due.

## 2020-05-15 NOTE — Assessment & Plan Note (Signed)
Self-limited to mornings when taking euthyroid, has not caused accidents, no red flags. Dizziness could also be due to severe anemia, treating with iron supplementation, will discuss with patient possibility/interest in IV infusions. Reasonable to try switching back to synthroid and assessing if this resolves the problem. Recommend she try to take pill at different time than before driving to work to prevent any side effects from occurring while driving. Will follow up in 2-3 weeks to ensure improvement. Cautioned that if worsens, has episode of LOC, syncope, new speech or motor problems, to call us and go to emergency room.

## 2020-05-15 NOTE — Assessment & Plan Note (Addendum)
Ptient with h/o anemia, now hgb low to 8.2, iron low to 7. Patient taking iron supplementation, recently started. Chronic blood loss most likely due to menorrhagia, please see that problem. Most likely cause for fatigue. - continue daily fe supplementation - orange juice, green leafy vegetables - consider IV iron infusion

## 2020-05-20 ENCOUNTER — Telehealth: Payer: Self-pay | Admitting: Family Medicine

## 2020-05-20 NOTE — Telephone Encounter (Signed)
Called to discuss lab results with patient. Thyroid panel is appropriate, no change. Patient does have significant anemia to 8.2 that is decreased from 10.8 in 2019. This is due to menorrhagia. Patient has tried depo, IUD, and nexplanon with no improvement. Not a candidate for OCPs due to age and smoking history. Discussed referral to OBGYN as she may be a good candidate for ablation, but patient is not interested in that at this time. She started taking iron supplementation 1 week ago and has taken the pill twice because she has nausea and discomfort. Patient also is not insured and is concerned about how to afford these necessary interventions. She would be an appropriate candidate for IV iron, but wishes to try oral supplementation first. Due to nausea, recommend that she take fe supplementation every other day with orange juice- Vit C helps absorption. She will also apply for the orange card, which is what she had in 2012 when she had her thyroid removed, she is familiar with the process. We have an appointment scheduled for 06/24/20 for pap smear and follow up CBC to assess anemia and discuss referral at that time.  Shirlean Mylar, MD [facility], PGY-2

## 2020-06-24 ENCOUNTER — Ambulatory Visit: Payer: Self-pay | Admitting: Family Medicine

## 2020-11-11 ENCOUNTER — Ambulatory Visit: Payer: Self-pay | Admitting: Family Medicine

## 2020-11-11 NOTE — Progress Notes (Deleted)
    SUBJECTIVE:   CHIEF COMPLAINT / HPI:   ***  PERTINENT  PMH / PSH: ***  OBJECTIVE:   There were no vitals taken for this visit.  ***  ASSESSMENT/PLAN:   No problem-specific Assessment & Plan notes found for this encounter.     Albertina Leise, MD [facility]   

## 2020-11-11 NOTE — Patient Instructions (Incomplete)
It was a pleasure to see you today!  1. We will get some labs today.  If they are abnormal or we need to do something about them, I will call you.  If they are normal, I will send you a message on MyChart (if it is active) or a letter in the mail.  If you don't hear from [place] in 2 weeks, please call the office  [phone].     Be Well,  Dr. Leary Roca

## 2021-05-15 ENCOUNTER — Other Ambulatory Visit: Payer: Self-pay | Admitting: Family Medicine

## 2021-05-15 DIAGNOSIS — E890 Postprocedural hypothyroidism: Secondary | ICD-10-CM

## 2021-07-27 ENCOUNTER — Other Ambulatory Visit: Payer: Self-pay | Admitting: Family Medicine

## 2021-07-27 DIAGNOSIS — E890 Postprocedural hypothyroidism: Secondary | ICD-10-CM

## 2021-07-27 NOTE — Telephone Encounter (Signed)
Received rx request for levothyroxine, last time patient was seen was August 2021, needs updated lab work. Called patient and scheduled her for 08/06/21 at 3:30 PM. Will give 30 day supply now and future refills after blood work completed.  Shirlean Mylar, MD [facility], PGY-3

## 2021-08-06 ENCOUNTER — Ambulatory Visit: Payer: Self-pay | Admitting: Family Medicine

## 2021-08-26 ENCOUNTER — Ambulatory Visit: Payer: Self-pay | Admitting: Family Medicine

## 2021-08-26 ENCOUNTER — Ambulatory Visit (INDEPENDENT_AMBULATORY_CARE_PROVIDER_SITE_OTHER): Payer: Self-pay | Admitting: Family Medicine

## 2021-08-26 ENCOUNTER — Other Ambulatory Visit: Payer: Self-pay

## 2021-08-26 VITALS — BP 120/79 | HR 97 | Wt 180.6 lb

## 2021-08-26 DIAGNOSIS — E890 Postprocedural hypothyroidism: Secondary | ICD-10-CM

## 2021-08-26 DIAGNOSIS — D500 Iron deficiency anemia secondary to blood loss (chronic): Secondary | ICD-10-CM

## 2021-08-26 DIAGNOSIS — N921 Excessive and frequent menstruation with irregular cycle: Secondary | ICD-10-CM

## 2021-08-26 NOTE — Progress Notes (Addendum)
    SUBJECTIVE:   CHIEF COMPLAINT / HPI:   Ms. Katie Obrien is a 45 yo F who presents for the below.   Heavy periods Chronic issues. Taking a medication called blood builder. She is not taking iron due to stomach upset. Currently on her cycle. Changes her pads/tampons every 30 minutes. She usually has 2 weeks in between periods. She endorses fatigue, hot flashes. Denies light headedness, headaches, and palpitations. Has a tubal but has never considered endometrial ablation or hysterectomy.   Thyroid Labs repeated Hx of graves disease s/p total thyroidectomy. Would like to check her thyroid levels. Takes her medication as prescribed. Has cold intolerance, fatigue but she believes this is due to her anemia.   PERTINENT  PMH / PSH: As above.   OBJECTIVE:   BP 120/79   Pulse 97   Wt 180 lb 9.6 oz (81.9 kg)   SpO2 100%   BMI 25.19 kg/m   General: Appears well, no acute distress. Age appropriate. Cardiac: RRR, normal heart sounds, no murmurs Respiratory: CTAB, normal effort Pelvic exam: declined by patient. Neuro: alert and oriented Psych: normal affect   ASSESSMENT/PLAN:   Iron deficiency anemia due to chronic blood loss  Menorrhagia with irregular cycle VSS. Mild sx today. Reviewed PCP note from 05/14/20. Pelvic exam declined by patient 2/2 currently menstruating. Discussed iron supplement every other day with orange juice for better absorption and toleration. Will obtain labs today. Consider need for blood products or IV iron. Discussed considering hysterectomy v. Ablation. Referral to GYN place to initiate this conversation. Patient agreeable with plan.  - CBC - Ferritin - Ambulatory referral to Gynecology  Hypothyroidism, postop Switched to synthroid 1 year prior. At the time 2-3 week follow up was suggested. Patient did not follow up. Will obtain labs and consider need for medication dose changes. For now continue current dose.  - TSH  Katie Jumbo, DO [facility]

## 2021-08-26 NOTE — Patient Instructions (Signed)
Thank for coming in today.  Today we discussed getting your thyroid lab as well as looking at labs for anemia.  I have also referred you to the OB/GYN office for further evaluations for heavy menstrual bleeding.   Dr. Salvadore Dom

## 2021-08-27 LAB — CBC
Hematocrit: 35.2 % (ref 34.0–46.6)
Hemoglobin: 11 g/dL — ABNORMAL LOW (ref 11.1–15.9)
MCH: 25.6 pg — ABNORMAL LOW (ref 26.6–33.0)
MCHC: 31.3 g/dL — ABNORMAL LOW (ref 31.5–35.7)
MCV: 82 fL (ref 79–97)
Platelets: 419 10*3/uL (ref 150–450)
RBC: 4.3 x10E6/uL (ref 3.77–5.28)
RDW: 14.8 % (ref 11.7–15.4)
WBC: 7.1 10*3/uL (ref 3.4–10.8)

## 2021-08-27 LAB — FERRITIN: Ferritin: 10 ng/mL — ABNORMAL LOW (ref 15–150)

## 2021-08-27 LAB — TSH: TSH: 1.09 u[IU]/mL (ref 0.450–4.500)

## 2021-08-29 ENCOUNTER — Other Ambulatory Visit: Payer: Self-pay | Admitting: Family Medicine

## 2021-08-29 DIAGNOSIS — E890 Postprocedural hypothyroidism: Secondary | ICD-10-CM

## 2021-08-31 ENCOUNTER — Encounter: Payer: Self-pay | Admitting: Family Medicine

## 2021-11-23 ENCOUNTER — Ambulatory Visit (INDEPENDENT_AMBULATORY_CARE_PROVIDER_SITE_OTHER): Payer: Self-pay | Admitting: Family Medicine

## 2021-11-23 ENCOUNTER — Other Ambulatory Visit: Payer: Self-pay

## 2021-11-23 ENCOUNTER — Encounter: Payer: Self-pay | Admitting: Family Medicine

## 2021-11-23 ENCOUNTER — Other Ambulatory Visit (HOSPITAL_COMMUNITY)
Admission: RE | Admit: 2021-11-23 | Discharge: 2021-11-23 | Disposition: A | Discharge status: Home / Self Care | Payer: Self-pay | Source: Ambulatory Visit | Attending: Family Medicine | Admitting: Family Medicine

## 2021-11-23 VITALS — BP 132/81 | HR 85 | Wt 191.4 lb

## 2021-11-23 DIAGNOSIS — Z124 Encounter for screening for malignant neoplasm of cervix: Secondary | ICD-10-CM

## 2021-11-23 DIAGNOSIS — N939 Abnormal uterine and vaginal bleeding, unspecified: Secondary | ICD-10-CM

## 2021-11-23 DIAGNOSIS — D500 Iron deficiency anemia secondary to blood loss (chronic): Secondary | ICD-10-CM

## 2021-11-23 DIAGNOSIS — Z0000 Encounter for general adult medical examination without abnormal findings: Secondary | ICD-10-CM

## 2021-11-23 DIAGNOSIS — Z113 Encounter for screening for infections with a predominantly sexual mode of transmission: Secondary | ICD-10-CM | POA: Insufficient documentation

## 2021-11-23 DIAGNOSIS — A5901 Trichomonal vulvovaginitis: Secondary | ICD-10-CM

## 2021-11-23 DIAGNOSIS — Z114 Encounter for screening for human immunodeficiency virus [HIV]: Secondary | ICD-10-CM

## 2021-11-23 DIAGNOSIS — N898 Other specified noninflammatory disorders of vagina: Secondary | ICD-10-CM

## 2021-11-23 DIAGNOSIS — E890 Postprocedural hypothyroidism: Secondary | ICD-10-CM

## 2021-11-23 HISTORY — DX: Trichomonal vulvovaginitis: A59.01

## 2021-11-23 LAB — POCT WET PREP (WET MOUNT): Clue Cells Wet Prep Whiff POC: POSITIVE

## 2021-11-23 MED ORDER — METRONIDAZOLE 500 MG PO TABS: 500.0000 mg | ORAL_TABLET | Freq: Two times a day (BID) | ORAL | 0 refills | Status: DC

## 2021-11-23 NOTE — Assessment & Plan Note (Signed)
Pap smear, GC, HIV, RPR obtained today

## 2021-11-23 NOTE — Assessment & Plan Note (Signed)
Significant history of recent AUB. Will obtain CBC, TSH, and perform pap smear today. Will obtain complete pelvic u/s transvaginal. Patient does not have insurance, orange card application given. Given level of bleeding, still believe a limited but appropriate work up including the labs above and [place] is appropriate. Recommend patient not restart ashwagandha.  Follow up based on results. If bleeding returns, recommend ibuprofen 600 mg q6h x3d, return to care if no improvement.

## 2021-11-23 NOTE — Assessment & Plan Note (Signed)
Wet prep showed + trich and BV. Treat with flagyl 500 mg BID x7d.

## 2021-11-23 NOTE — Assessment & Plan Note (Signed)
Check TSH today

## 2021-11-23 NOTE — Progress Notes (Signed)
° ° °  SUBJECTIVE:   CHIEF COMPLAINT / HPI:   Abnormal uterine bleeding: Patient reports that she had started ashwarganda around the week of Christmas and that same week she started having heavy vaginal bleeding. She reports daily vaginal bleeding from that time until this weekend. She would use 8-10 tampons per day. She reports small clots (none larger than quarter). She stopped taking ashwarganda last week and vaginal bleeding stopped this weekend. She is due for a pap smear this year. Last one was in 2018, NILM, HPV negative. No history of abnormal pap smears. She reports some fatigue, but no fainting, no h/o blood dyscrasia.  Hypothyroidism: S/p thyroidectomy due to goiter. Will check TSH. Reports adherence with synthroid 150 mcg qd.  Insurance: patient does not have insurance. Gave orange card application.  PERTINENT  PMH / PSH: iron deficiency anemia, hypothyroidism, menorrhagia  OBJECTIVE:   BP 132/81    Pulse 85    Wt 191 lb 6.4 oz (86.8 kg)    LMP 11/22/2021    SpO2 100%    BMI 26.69 kg/m   Nursing note and vitals reviewed GEN: appears older than stated age, [employer], resting comfortably in chair, NAD, WNWD Neck: Supple. No LAD Abdomen: Soft, NT, ND. Normoactive bowel sounds. No rebound or guarding.    PELVIC:  Normal appearing external female genitalia, skin tag on righ vulva 5 O'clock about 1.5 cm from edge of labia majora, normal vaginal epithelium, no abnormal discharge. Cervix appears pale, friable. Bimanual: No CMT, 0 week uterus, nontender.  No palpable adnexal masses. Neuro: AOx3  Ext: no edema Psych: Pleasant and appropriate   ASSESSMENT/PLAN:   Hypothyroidism, postop Check TSH today.  Abnormal uterine bleeding Significant history of recent AUB. Will obtain CBC, TSH, and perform pap smear today. Will obtain complete pelvic u/s transvaginal. Patient does not have insurance, orange card application given. Given level of bleeding, still believe a limited but appropriate work up  including the labs above and [place] is appropriate. Recommend patient not restart ashwagandha.  Follow up based on results. If bleeding returns, recommend ibuprofen 600 mg q6h x3d, return to care if no improvement.  Healthcare maintenance Pap smear, GC, HIV, RPR obtained today  Trichomonas vaginalis (TV) infection Wet prep showed + trich and BV. Treat with flagyl 500 mg BID x7d.     Shirlean Mylar, MD [facility]

## 2021-11-23 NOTE — Patient Instructions (Signed)
It was a pleasure to see you today!  We will get some labs today.  If they are abnormal or we need to do something about them, I will call you.  If they are normal, I will send you a message on MyChart (if it is active) or a letter in the mail.  If you don't hear from [place] in 2 weeks, please call the office  [phone]. If you have more bleeding, please let me know. For starters, try ibuprofen 600 mg every 6 hours for 3 days. If this does not help the bleeding, return for evaluation. We will schedule a pelvic ultrasound for you, this is to look at common causes of bleeding Follow up will be determined by results   Be Well,  Dr. Leary Roca

## 2021-11-24 LAB — CERVICOVAGINAL ANCILLARY ONLY
Chlamydia: NEGATIVE
Comment: NEGATIVE
Comment: NORMAL
Neisseria Gonorrhea: NEGATIVE

## 2021-11-25 LAB — CYTOLOGY - PAP
Comment: NEGATIVE
Diagnosis: NEGATIVE
High risk HPV: NEGATIVE

## 2021-12-01 ENCOUNTER — Ambulatory Visit (HOSPITAL_COMMUNITY): Payer: Self-pay

## 2021-12-01 LAB — RPR: RPR Ser Ql: NONREACTIVE

## 2021-12-01 LAB — CBC WITH DIFFERENTIAL/PLATELET
Basophils Absolute: 0.1 10*3/uL (ref 0.0–0.2)
Basos: 1 %
EOS (ABSOLUTE): 0.2 10*3/uL (ref 0.0–0.4)
Eos: 3 %
Hematocrit: 31.7 % — ABNORMAL LOW (ref 34.0–46.6)
Hemoglobin: 9.6 g/dL — ABNORMAL LOW (ref 11.1–15.9)
Immature Grans (Abs): 0 10*3/uL (ref 0.0–0.1)
Immature Granulocytes: 0 %
Lymphocytes Absolute: 3.4 10*3/uL — ABNORMAL HIGH (ref 0.7–3.1)
Lymphs: 38 %
MCH: 22.9 pg — ABNORMAL LOW (ref 26.6–33.0)
MCHC: 30.3 g/dL — ABNORMAL LOW (ref 31.5–35.7)
MCV: 76 fL — ABNORMAL LOW (ref 79–97)
Monocytes Absolute: 0.5 10*3/uL (ref 0.1–0.9)
Monocytes: 5 %
Neutrophils Absolute: 4.7 10*3/uL (ref 1.4–7.0)
Neutrophils: 53 %
Platelets: 503 10*3/uL — ABNORMAL HIGH (ref 150–450)
RBC: 4.19 x10E6/uL (ref 3.77–5.28)
RDW: 16.4 % — ABNORMAL HIGH (ref 11.7–15.4)
WBC: 8.9 10*3/uL (ref 3.4–10.8)

## 2021-12-01 LAB — HIV ANTIBODY (ROUTINE TESTING W REFLEX): HIV Screen 4th Generation wRfx: NONREACTIVE

## 2021-12-01 LAB — TSH

## 2021-12-15 ENCOUNTER — Telehealth: Payer: Self-pay | Admitting: Family Medicine

## 2021-12-15 ENCOUNTER — Encounter: Payer: Self-pay | Admitting: Family Medicine

## 2021-12-15 ENCOUNTER — Other Ambulatory Visit: Payer: Self-pay | Admitting: Family Medicine

## 2021-12-15 DIAGNOSIS — E890 Postprocedural hypothyroidism: Secondary | ICD-10-CM

## 2021-12-15 NOTE — Telephone Encounter (Signed)
Called patient to discuss lab results, no answer, left HIPAA compliant VM.  ? ?HIV, syphilis, gonorrhea and chlamydia all negative.  ? ?She does have anemia, it is about baseline at 9. She will need iron supplementation, take one iron tablet with a glass of orange juice (vitamin c helps with iron absorption) daily. We will need to follow this closely. If she has trouble taking the iron pill, we could do an IV iron infusion, but this can be expensive and iron pills are much more affordable. ? ?Has she completed the orange card application AND turned it in yet? ? ?Her pap smear results were normal, no HPV. She will need repeat pap in 5 years in 2028. ? ?It took a long time to get results back from [facility], but apparently there was not enough blood in the TSH bottle to get a number on her thyroid. We will have to repeat this and it is very important as it could be the cause of her bleeding. ? ?She canceled the pelvic ultrasound. Please ask why if she calls back, this could be due to cost. If orange card application completed and accepted, then she may get this covered. It is important to assess why she is having bleeding.  ? ?Will send letter as well. ? ?Shirlean Mylar, MD ?[facility], PGY-3 ? ?

## 2021-12-15 NOTE — Progress Notes (Signed)
Future TSH placed. ? ?Shirlean Mylar, MD ?[facility], PGY-3 ? ?

## 2021-12-15 NOTE — Progress Notes (Signed)
See phone note. ?Shirlean Mylar, MD ?[facility], PGY-3 ? ?

## 2021-12-15 NOTE — Telephone Encounter (Signed)
Pt returned call.  Informed of message from Dr.Mahoney. ? ?Pt reports that she does not qualify for CAFA or orange card so she will have to get it thru her job, this will be in October. ? ?She reports that she is already taking iron but will try the orange juice to help. ? ?Also, she stopped bleeding on Wednesday and reports that she feels better.  This and the price of the u/s is why she canceled the appt. ? ?Please place future order for TSH, so that she can make a lab appt. Jone Baseman, CMA ? ?

## 2021-12-18 NOTE — Telephone Encounter (Signed)
Spoke with patient. Made Lab appt for 3/27 at 4:15. Aquilla Solian, CMA ? ?

## 2021-12-21 ENCOUNTER — Other Ambulatory Visit: Payer: Self-pay

## 2021-12-21 DIAGNOSIS — E890 Postprocedural hypothyroidism: Secondary | ICD-10-CM

## 2021-12-22 ENCOUNTER — Encounter: Payer: Self-pay | Admitting: Family Medicine

## 2021-12-22 LAB — TSH: TSH: 0.557 u[IU]/mL (ref 0.450–4.500)

## 2022-01-04 ENCOUNTER — Telehealth: Payer: Self-pay | Admitting: Family Medicine

## 2022-01-04 NOTE — Telephone Encounter (Signed)
Called patient re: TSH and vaginal bleeding. Repeat TSH WNL, continue current care. ? ?Regarding vaginal bleeding, reassuring that pap smear was normal. Unfortunately, patient is limited in access due to insurance. Cannot afford pelvic US which is best next step to r/o structural causes of abnormal uterine bleeding, won't be able to afford until October. Patient is 46 yo, so not at elevated risk of endometrial malignancy, but risk still exists. I found out from pathology that cost of EMB path is $75. I want to give patient the option that she could have EMB done to r/o malignancy as October is 6 months away, although again, lower overall, but non zero risk of malignancy. ? ?Left HIPAA compliant VM. If patient calls back, I am happy to discuss this further, also ok to tell her above information. Will send certified letter. ? ?Shirlean Mylar, MD ?[facility], PGY-3 ? ?

## 2022-03-06 ENCOUNTER — Other Ambulatory Visit: Payer: Self-pay | Admitting: Family Medicine

## 2022-03-06 DIAGNOSIS — E890 Postprocedural hypothyroidism: Secondary | ICD-10-CM

## 2022-04-27 ENCOUNTER — Ambulatory Visit (INDEPENDENT_AMBULATORY_CARE_PROVIDER_SITE_OTHER): Payer: Self-pay | Admitting: Family Medicine

## 2022-04-27 DIAGNOSIS — R109 Unspecified abdominal pain: Secondary | ICD-10-CM | POA: Insufficient documentation

## 2022-04-27 DIAGNOSIS — R1012 Left upper quadrant pain: Secondary | ICD-10-CM

## 2022-04-27 DIAGNOSIS — G479 Sleep disorder, unspecified: Secondary | ICD-10-CM

## 2022-04-27 NOTE — Patient Instructions (Addendum)
It was great seeing you today!  Today we discussed your abdominal pain, I am sorry that you have not been feeling well but glad that you are improving. I believe this is due to a possible case of food poisoning. Please make sure to stay hydrated, drinking Gatorade can help as well. Your lungs sound great!   Please develop a bedtime routine so that you are getting your body ready for bed each night. Please avoid screens at least 30 minutes before bed as this can interfere with our circadian rhythm and make it difficult for [place] to fall asleep. If you do not fall asleep within 20 minutes then get up and do something else before returning to bed to try again. Mediating and maintaining a journal can help as well.     Please follow up at your next scheduled appointment in 3-4 weeks, if anything arises between now and then, please don't hesitate to contact our office.   Thank you for allowing [place] to be a part of your medical care!  Thank you, Dr. Robyne Peers

## 2022-04-27 NOTE — Assessment & Plan Note (Signed)
-  pain seems to be improving and likely secondary to multiple episodes of diarrhea -instructed to maintain adequate hydration -supportive care discussed without need for further intervention at this time

## 2022-04-27 NOTE — Progress Notes (Signed)
    SUBJECTIVE:   CHIEF COMPLAINT / HPI:   Patient presents with concern for her symptoms. She had sushi about 4 days ago and then experienced sharp, left-sided  abdominal pain. Denies fever, vomiting, dysuria, hematuria and hematochezia. Endorsing diarrhea and chills. She feels like she has fluid in her lower left ribs. Aggravating factors include movement. Relieving factors include sitting better and resting. No one else in the household has these symptoms as she lives alone. Staying hydrated, although decreased appetite she is still eating. Denies cough, congestion, dyspnea, chest pain and other symptoms.   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 One son is incarcerated and the other son is in addiction rehab. She admits to having a lot of anxiety. Gets about 3-5 hours of sleep a night. She relies on herself as her own support system. She has participated in therapy before and is starting counseling soon within the next 2-3 weeks.   OBJECTIVE:   BP 110/68   Pulse 77   Ht 5\' 11"  (1.803 m)   Wt 188 lb 6 oz (85.4 kg)   LMP 03/30/2022   SpO2 100%   BMI 26.27 kg/m    General: Patient well-appearing, in no acute distress. HEENT: PERRLA, no tonsillar exudates or erythema noted, normal conjunctiva, sclera white, no evidence of cervical LAD CV: RRR, no murmurs or gallops auscultated Resp: CTAB, no wheezing, rales or rhonchi noted both anteriorly and posteriorly, breathing comfortably on room air Abdomen: soft, very mild tenderness noted upon deep palpation of LUQ, nondistended, presence of bowel sounds MSK: no CVA tenderness noted, chest wall tenderness noted on deep palpation Ext: radial pulses strong and equal bilaterally, capillary refill less than 2 sec Psych: mood appropriate, pleasant, denies SI   ASSESSMENT/PLAN:   Abdominal pain -pain seems to be improving and likely secondary to multiple episodes of diarrhea -instructed to maintain adequate  hydration -supportive care discussed without need for further intervention at this time  Sleep difficulties -difficult with sleeping likely secondary to ongoing personal stressors with component of possible depression  -PHQ-9 score of 13 with negative question 9 reviewed and discussed.  -extensively discussed sleep hygiene  -encouraged counseling and patient will consider therapy which we will plan to discuss at next visit along with possibly starting an SSRI after MDQ -reassurance provided  -follow up with PCP in 3-4 weeks for mood check       Juddson Cobern 05/31/2022, DO [facility]

## 2022-04-27 NOTE — Assessment & Plan Note (Addendum)
-  difficult with sleeping likely secondary to ongoing personal stressors with component of possible depression  -PHQ-9 score of 13 with negative question 9 reviewed and discussed.  -extensively discussed sleep hygiene  -encouraged counseling and patient will consider therapy which we will plan to discuss at next visit along with possibly starting an SSRI after MDQ -reassurance provided  -follow up with PCP in 3-4 weeks for mood check

## 2022-09-13 ENCOUNTER — Other Ambulatory Visit: Payer: Self-pay

## 2022-09-13 DIAGNOSIS — E890 Postprocedural hypothyroidism: Secondary | ICD-10-CM

## 2022-09-14 ENCOUNTER — Other Ambulatory Visit: Payer: Self-pay | Admitting: Family Medicine

## 2022-09-14 DIAGNOSIS — E890 Postprocedural hypothyroidism: Secondary | ICD-10-CM

## 2022-09-14 MED ORDER — LEVOTHYROXINE SODIUM 150 MCG PO TABS: ORAL_TABLET | ORAL | 0 refills | Status: DC

## 2022-10-05 ENCOUNTER — Ambulatory Visit (INDEPENDENT_AMBULATORY_CARE_PROVIDER_SITE_OTHER): Payer: Self-pay | Admitting: Family Medicine

## 2022-10-05 ENCOUNTER — Encounter: Payer: Self-pay | Admitting: Family Medicine

## 2022-10-05 VITALS — BP 108/62 | HR 80 | Wt 183.4 lb

## 2022-10-05 DIAGNOSIS — J069 Acute upper respiratory infection, unspecified: Secondary | ICD-10-CM

## 2022-10-05 DIAGNOSIS — E890 Postprocedural hypothyroidism: Secondary | ICD-10-CM

## 2022-10-05 HISTORY — DX: Acute upper respiratory infection, unspecified: J06.9

## 2022-10-05 MED ORDER — LEVOTHYROXINE SODIUM 150 MCG PO TABS: ORAL_TABLET | ORAL | 0 refills | Status: DC

## 2022-10-05 NOTE — Patient Instructions (Addendum)
It was great to see you! Thank you for allowing me to participate in your care!  I recommend that you always bring your medications to each appointment as this makes it easy to ensure we are on the correct medications and helps [place] not miss when refills are needed.  Our plans for today:  -I believe you probably have the flu given your exposure.  Please continue supportive care for this as we discussed.  If you develop difficulty breathing, fever over 102, or your symptoms do not start to improve over the next 3 days please make a follow-up appointment or go to an urgent care. -Please make a follow-up appointment for 1 to 2 months to discuss chronic medical problems.   Please arrive 15 minutes PRIOR to your next scheduled appointment time! If you do not, this affects OTHER patients' care.  Take care and seek immediate care sooner if you develop any concerns.   Dr. Salvadore Oxford, MD [place]

## 2022-10-05 NOTE — Progress Notes (Signed)
    SUBJECTIVE:   CHIEF COMPLAINT / HPI: sick symptoms  Traveled for work last Thursday. Workers had flu. Subjective fevers at night. Feels like its breaking. Feels super week. Muscle aches. Ibuprofren, Alka Seltzer Nyquil and Dayquil. Some congestion. Felt like "lungs were on fire" on Friday. Sore throat and difficulty breathing. Decent amount of nausea. Vomiting daily last time yesterday afternoon. Keeping fluids down. Not eating as much. No diarrhea.   PERTINENT  PMH / PSH: Bipolar 1, IDA, Hypothyroidism s/p thyroidectomy  OBJECTIVE:   BP 108/62   Pulse 80   Wt 183 lb 6.4 oz (83.2 kg)   SpO2 96%   BMI 25.58 kg/m   General: NAD, sitting comfortably in hospital chair HEENT: Pupils equal round and reactive, moist mucous membranes, no posterior oropharyngeal erythema, no signs of tonsillar adenitis or exudates, bilateral nasal turbinates noninflamed, no visible rhinorrhea, sounds congested Cardiovascular: RRR, no murmurs, no peripheral edema Respiratory: normal WOB on RA, breathing audibly through mouth, CTAB, no wheezes, ronchi or rales Extremities: Moving all 4 extremities equally   ASSESSMENT/PLAN:   Viral URI Assessment & Plan: Patient likely has Influenza given patient history, symptoms, and exposure. Patient is stable, reassuring respiratory status, afebrile, and tolerating PO fluids. No evidence of bacterial infection requiring antibiotic. Other viral URI maybe cause as well. Shared decision making regarding POCT test, patient elected no test at this time as she is outside of Tamiflu window. I agree as management would not changed. Supportive care discussed. Strict return precautions addressed.   Hypothyroidism, postop Assessment & Plan: Follow-up 1 to 2 months to recheck thyroid levels when not acutely ill.  Refilled levothyroxine in the meantime.  Orders: -     Levothyroxine Sodium; TAKE 1 TABLET(150 MCG) BY MOUTH DAILY BEFORE AND BREAKFAST. MAKE APPOINTMENT BEFORE NEXT  REFILL  Dispense: 60 tablet; Refill: 0   Return in about 1 month (around 11/05/2022) for Hypothyroidism, IDA follow-up.  Salvadore Oxford, MD [place]

## 2022-10-05 NOTE — Assessment & Plan Note (Addendum)
Patient likely has Influenza given patient history, symptoms, and exposure. Patient is stable, reassuring respiratory status, afebrile, and tolerating PO fluids. No evidence of bacterial infection requiring antibiotic. Other viral URI maybe cause as well. Shared decision making regarding POCT test, patient elected no test at this time as she is outside of Tamiflu window. I agree as management would not changed. Supportive care discussed. Strict return precautions addressed.

## 2022-10-05 NOTE — Assessment & Plan Note (Signed)
Follow-up 1 to 2 months to recheck thyroid levels when not acutely ill.  Refilled levothyroxine in the meantime.

## 2023-01-15 ENCOUNTER — Other Ambulatory Visit: Payer: Self-pay | Admitting: Family Medicine

## 2023-01-15 DIAGNOSIS — E890 Postprocedural hypothyroidism: Secondary | ICD-10-CM

## 2023-03-11 ENCOUNTER — Ambulatory Visit (INDEPENDENT_AMBULATORY_CARE_PROVIDER_SITE_OTHER): Payer: Self-pay | Admitting: Family Medicine

## 2023-03-11 VITALS — BP 110/80 | HR 81 | Ht 71.0 in | Wt 187.0 lb

## 2023-03-11 DIAGNOSIS — F419 Anxiety disorder, unspecified: Secondary | ICD-10-CM

## 2023-03-11 DIAGNOSIS — D500 Iron deficiency anemia secondary to blood loss (chronic): Secondary | ICD-10-CM

## 2023-03-11 DIAGNOSIS — E890 Postprocedural hypothyroidism: Secondary | ICD-10-CM

## 2023-03-11 DIAGNOSIS — F17200 Nicotine dependence, unspecified, uncomplicated: Secondary | ICD-10-CM

## 2023-03-11 DIAGNOSIS — N939 Abnormal uterine and vaginal bleeding, unspecified: Secondary | ICD-10-CM

## 2023-03-11 MED ORDER — BUPROPION HCL ER (XL) 150 MG PO TB24: ORAL_TABLET | ORAL | 0 refills | Status: DC

## 2023-03-11 NOTE — Progress Notes (Signed)
Thyroid f/u. Requesting bloodwork Requesting to start meds for anxiety & sleep.  No to Tdap vax.

## 2023-03-11 NOTE — Patient Instructions (Addendum)
It was wonderful to see you today. Thank you for allowing me to be a part of your care. Below is a short summary of what we discussed at your visit today:  Anxious Start Wellbutrin 150 mg daily. For 14 days.  Then gp up to 300 mg daily.  Come back for check in in 3-4 weeks with Dr. Raymondo Band.  Make monthly or every other month check ups with Dr. Velna Ochs, your PCP.   Insomnia Try melatonin or sleepy time tea with valerian root.  Limit how much caffeine you drink throughout the day.  See below for resources.   Smoking cessation resources Start Wellbutrin.  Please come back for smoking cessation appointments with Dr. Raymondo Band. Follow up in 3-4 weeks with Dr. Woody Seller Smoking Hotline:  800-QUIT-NOW [phone])     Please bring all of your medications to every appointment! If you have any questions or concerns, please do not hesitate to contact us via phone or MyChart message.   Fayette Pho, MD   Insomnia Today we discussed your difficulty sleeping. I recommend two things to start: melatonin and a regular wake up time.   Melatonin: Take 5 mg of over-the counter melatonin about 30 minutes before bed. I recommend taking it before you move to the bedroom to put on pajamas and brush your teeth.   Regular wake up time: Wake up at the same time every day and do not take day time naps.   See below for more tips and web sites with reliable information for you.   If you have any questions or concerns, please do not hesitate to contact us via phone or MyChart message.   Fayette Pho, MD   CDC tips for better sleep [employer]     American Society of Sleep Medicine  https://sleepeducation.org/healthy-sleep/healthy-sleep-habits/    Healthy Sleep Habits Your behaviors during the day, and especially before bedtime, can have a major impact on your sleep. They can promote healthy sleep or contribute to sleeplessness.  Your daily  routines - what you eat and drink, the medications you take, how you schedule your days and how you choose to spend your evenings - can significantly impact your quality of sleep. Even a few slight adjustments can, in some cases, mean the difference between sound sleep and a restless night.  The term "sleep hygiene" refers to a series of healthy sleep habits that can improve your ability to fall asleep and stay asleep. These habits can help improve your sleep health.  When people struggle with insomnia, sleep hygiene is an important part of cognitive behavioral therapy (CBT), the most effective long-term treatment for people with chronic insomnia. CBT for insomnia can help you address the thoughts and behaviors that prevent you from sleeping well. It also includes techniques for stress reduction, relaxation and sleep schedule management.  If you have difficulty sleeping or want to improve your sleep, try following these healthy sleep habits. Talk to your medical provider if your sleep problem persists. You also can seek help from the sleep team at an AASM accredited sleep center.  Quick sleep tips Keep a consistent sleep schedule. Get up at the same time every day, even on weekends or during vacations. Set a bedtime that is early enough for you to get at least 7-8 hours of sleep. Don't go to bed unless you are sleepy. If you don't fall asleep after 20 minutes, get out of bed. Go do a quiet activity without a lot of light exposure. It is  especially important to not get on electronics. Establish a relaxing bedtime routine. Use your bed only for sleep and sex. Make your bedroom quiet and relaxing. Keep the room at a comfortable, cool temperature. Limit exposure to bright light in the evenings. Turn off electronic devices at least 30 minutes before bedtime. Don't eat a large meal before bedtime. If you are hungry at night, eat a light, healthy snack. Exercise regularly and maintain a healthy  diet. Avoid consuming caffeine in the afternoon or evening. Avoid consuming alcohol before bedtime. Reduce your fluid intake before bedtime.

## 2023-03-11 NOTE — Progress Notes (Unsigned)
   SUBJECTIVE:   CHIEF COMPLAINT / HPI:   Wonders about menopause Insomnia - wakes up around 1 am Gets home from work and sleeps 3-4 hours Anxiety Irritable Hot flashes for a couple years No issues with vaginal dryness Some pain with intercourse  Menstrual bleeding amount has lightened up for 4 months Will feel like she is about to start period, only lasts day or two Can come twice monthly  At baseline, would bleed heavily for 1-2 weeks Were monthly like clockwork  Did have some months with multiple periods in a month  Mood Was previously on welbutrin Helped her quit smoking Helped her mood quite a bit  Stopping smoking Recovering alcoholic Stopped 2 years ago  PERTINENT  PMH / PSH:  Patient Active Problem List   Diagnosis Date Noted   Viral URI 10/05/2022   Sleep difficulties 04/27/2022   Trichomonas vaginalis (TV) infection 11/23/2021   Iron deficiency anemia 05/15/2020   Healthcare maintenance 05/15/2020   Bipolar 1 disorder (HCC) 04/18/2019   Hyperlipidemia 11/29/2017   Obesity (BMI 30-39.9) 02/02/2013   Hypothyroidism, postop 11/23/2011   Abnormal uterine bleeding 06/28/2011   TOBACCO USER 06/14/2009     OBJECTIVE:   BP 110/80 (BP Location: Left Arm, Patient Position: Sitting, Cuff Size: Normal)   Pulse 81   Ht 5\' 11"  (1.803 m)   Wt 187 lb (84.8 kg)   SpO2 99%   BMI 26.08 kg/m   ***  ASSESSMENT/PLAN:   No problem-specific Assessment & Plan notes found for this encounter.     Fayette Pho, MD [facility]

## 2023-03-12 LAB — BASIC METABOLIC PANEL
BUN/Creatinine Ratio: 28 — ABNORMAL HIGH (ref 9–23)
BUN: 22 mg/dL (ref 6–24)
CO2: 19 mmol/L — ABNORMAL LOW (ref 20–29)
Calcium: 9.2 mg/dL (ref 8.7–10.2)
Chloride: 102 mmol/L (ref 96–106)
Creatinine, Ser: 0.8 mg/dL (ref 0.57–1.00)
Glucose: 71 mg/dL (ref 70–99)
Potassium: 4.9 mmol/L (ref 3.5–5.2)
Sodium: 138 mmol/L (ref 134–144)
eGFR: 93 mL/min/{1.73_m2} (ref >59)

## 2023-03-12 LAB — CBC
Hematocrit: 32.5 % — ABNORMAL LOW (ref 34.0–46.6)
Hemoglobin: 8.8 g/dL — ABNORMAL LOW (ref 11.1–15.9)
MCH: 19.2 pg — ABNORMAL LOW (ref 26.6–33.0)
MCHC: 27.1 g/dL — ABNORMAL LOW (ref 31.5–35.7)
MCV: 71 fL — ABNORMAL LOW (ref 79–97)
Platelets: 513 10*3/uL — ABNORMAL HIGH (ref 150–450)
RBC: 4.59 x10E6/uL (ref 3.77–5.28)
RDW: 17.8 % — ABNORMAL HIGH (ref 11.7–15.4)
WBC: 8 10*3/uL (ref 3.4–10.8)

## 2023-03-12 LAB — IRON,TIBC AND FERRITIN PANEL
Ferritin: 4 ng/mL — ABNORMAL LOW (ref 15–150)
Iron Saturation: 5 % — CL (ref 15–55)
Iron: 20 ug/dL — ABNORMAL LOW (ref 27–159)
Total Iron Binding Capacity: 427 ug/dL (ref 250–450)
UIBC: 407 ug/dL (ref 131–425)

## 2023-03-12 LAB — HEMOGLOBIN A1C
Est. average glucose Bld gHb Est-mCnc: 103 mg/dL
Hgb A1c MFr Bld: 5.2 % (ref 4.8–5.6)

## 2023-03-12 LAB — TSH: TSH: 8.65 u[IU]/mL — ABNORMAL HIGH (ref 0.450–4.500)

## 2023-03-12 NOTE — Progress Notes (Signed)
Anemia slightly worsened from last check although consistent with previous measurements. Iron low, will need repletion. Patient to take oral iron and we will discuss iron infusion Monday during normal business hours.    TSH indicates meed for higher dose of levothyroxine, will discuss Monday.   Fayette Pho, MD

## 2023-03-14 NOTE — Assessment & Plan Note (Signed)
Patient desires to restart Wellbutrin. Has been on this medication previously without issues. Does carry diagnosis of bipolar type 1, however this diagnosis was made during a period of heavy alcohol use. No s/s mania or hypomania previously even when on Wellbutrin.   Start Wellbutrin 150 mg daily for 14 days then increase to 300 mg daily. Return for monthly check ins with PCP.

## 2023-03-14 NOTE — Assessment & Plan Note (Signed)
Wellbutrin script to assist with smoking cessation. See Anxiety A&P for titration.   Provided information on  tobacco quitline.   Patient to follow up with Dr. Raymondo Band in 3 weeks.

## 2023-03-14 NOTE — Assessment & Plan Note (Signed)
TSH today. Currently on levothyroxine 150 mcg daily. Will adjust PRN.

## 2023-03-14 NOTE — Assessment & Plan Note (Signed)
Discussed that given she is still having menses that the blood work would not tell us much. Her symptoms may be the sign of early menopause. Will continue to follow.

## 2023-03-14 NOTE — Assessment & Plan Note (Signed)
Given history of IDA, patient amenable to CBC and iron panel with blood draw today. Not currently on supplementation. Will follow results.

## 2023-03-15 ENCOUNTER — Other Ambulatory Visit: Payer: Self-pay | Admitting: Family Medicine

## 2023-03-15 ENCOUNTER — Telehealth: Payer: Self-pay | Admitting: Family Medicine

## 2023-03-15 DIAGNOSIS — D500 Iron deficiency anemia secondary to blood loss (chronic): Secondary | ICD-10-CM

## 2023-03-15 DIAGNOSIS — E890 Postprocedural hypothyroidism: Secondary | ICD-10-CM

## 2023-03-15 MED ORDER — LEVOTHYROXINE SODIUM 175 MCG PO TABS: ORAL_TABLET | ORAL | 1 refills | Status: DC

## 2023-03-15 NOTE — Progress Notes (Signed)
Infusion orders for Feraheme x2 doses.   System prompt indicated ferric gluconate is preferred agent at this time and will need 4 transfusions of 250 mg as the equivalent dose.   Ordered ferric gluconate instead.   Left VM on [facility] at [phone].  Patient is amenable to transfusion any day and time, she is flexible and has no preferences. Will leave VM indicating perhaps one infusion weekly for 4 weeks.   We will need to call the patient with time and location of the infusion.   Fayette Pho, MD

## 2023-03-15 NOTE — Telephone Encounter (Signed)
Anemia slightly worsened from last check although consistent with previous measurements. Iron low, will need repletion. Patient to take oral iron and we will discuss iron infusion   Anemia:  - not currently taking PO iron because any time she does "it completely messes up my endocrine system" - some constipation and stomach pains at baseline that are exacerbated with iron supplementation  TSH indicates meed for higher dose of levothyroxine - is currently taking 150 mcg levothyroxine tablet - taking properly   Plan:  - iron transfusion at the [facility] infusion clinic - increase dose of levothyroxine - recheck CBC, iron, and TSH in about a month  Patient amenable to plan. No questions at this time.   Fayette Pho, MD

## 2023-03-16 ENCOUNTER — Telehealth: Payer: Self-pay

## 2023-03-16 NOTE — Telephone Encounter (Signed)
Katie Obrien from [facility] returns call to nurse line.   She asks to speak with ordering physician as the order needs to be changed.   Please call her at [phone].

## 2023-03-18 NOTE — Telephone Encounter (Signed)
Returned call to [facility] regarding issue with iron infusion order. Got it cleared up.  Patient has not yet been scheduled for iron infusion.  Please refer to orders only note from 6/18 for details regarding order for ferric gluconate infusion x4.  First available July 2 at 8 am. Appointment slot lasts 4 hours.   Called patient with information. All questions answered.   Fayette Pho, MD

## 2023-03-21 ENCOUNTER — Ambulatory Visit: Payer: Self-pay | Admitting: Student

## 2023-03-21 NOTE — Progress Notes (Deleted)
    SUBJECTIVE:   Chief compliant/HPI: annual examination  Katie Obrien is a 47 y.o. who presents today for an annual exam.   History tabs reviewed and updated ***.   Review of systems form reviewed and notable for ***.   Seen on 6/14 and was found to have iron deficiency anemia with hemoglobin of 8.8.  Iron of 28, ferritin of 4.  Likely in the setting of heavy menstrual periods.  Infusion orders for ferric gluconate was given.  This is scheduled for July 2 at 8 AM.  Denies any shortness of breath, palpitations, lightheadedness*** Never has had a colonoscopy***  OBJECTIVE:   There were no vitals taken for this visit.  ***  ASSESSMENT/PLAN:   No problem-specific Assessment & Plan notes found for this encounter.    Annual Examination  See AVS for age appropriate recommendations.   PHQ score ***, reviewed and discussed.  Blood pressure reviewed and at goal ***.  Asked about intimate partner violence and resources given as appropriate  The patient currently uses *** for contraception. Folate recommended as appropriate, minimum of 400 mcg per day.   Considered the following items based upon USPSTF recommendations: Diabetes screening: discussed A1c was 5.2 Screening for elevated cholesterol: {discussed/ordered:14545} HIV testing: {discussed/ordered:14545} Hepatitis C: {discussed/ordered:14545} Hepatitis B: {discussed/ordered:14545} Syphilis if at high risk: {discussed/ordered:14545} GC/CT {GC/CT screening :23818} Reviewed risk factors for latent tuberculosis and {not indicated/requested/declined:14582} Reviewed risk factors for osteoporosis. Using FRAX tool estimated risk of major osteoporotic fracture of  ***%, early screening {ordered not order:23822::"not ordered"}   Discussed family history, BRCA testing {not indicated/requested/declined:14582}. Tool used to risk stratify was ***.  Cervical cancer screening: {PAPTYPE:23819} Breast cancer screening:  {mammoscreen:23820} Colorectal cancer screening: {crcscreen:23821::"discussed, colonoscopy ordered"} if age 64 or over.   Follow up in 1 *** year or sooner if indicated.    Levin Erp, MD [facility]

## 2023-03-24 ENCOUNTER — Other Ambulatory Visit (HOSPITAL_COMMUNITY): Payer: Self-pay | Admitting: *Deleted

## 2023-03-24 DIAGNOSIS — D500 Iron deficiency anemia secondary to blood loss (chronic): Secondary | ICD-10-CM

## 2023-03-28 ENCOUNTER — Ambulatory Visit: Payer: Self-pay | Admitting: Pharmacist

## 2023-03-29 ENCOUNTER — Ambulatory Visit (HOSPITAL_COMMUNITY)
Admission: RE | Admit: 2023-03-29 | Discharge: 2023-03-29 | Disposition: A | Discharge status: Home / Self Care | Payer: Self-pay | Source: Ambulatory Visit | Attending: Family Medicine | Admitting: Family Medicine

## 2023-03-29 DIAGNOSIS — D500 Iron deficiency anemia secondary to blood loss (chronic): Secondary | ICD-10-CM | POA: Insufficient documentation

## 2023-03-29 MED ORDER — SODIUM CHLORIDE 0.9 % IV SOLN
250.0000 mg | INTRAVENOUS | Status: DC
  Administered 2023-03-29: 250 mg via INTRAVENOUS
  Filled 2023-03-29: qty 20

## 2023-04-05 ENCOUNTER — Encounter (HOSPITAL_COMMUNITY)
Admission: RE | Admit: 2023-04-05 | Discharge: 2023-04-05 | Disposition: A | Discharge status: Home / Self Care | Payer: Self-pay | Source: Ambulatory Visit | Attending: Family Medicine | Admitting: Family Medicine

## 2023-04-05 DIAGNOSIS — D500 Iron deficiency anemia secondary to blood loss (chronic): Secondary | ICD-10-CM | POA: Insufficient documentation

## 2023-04-05 MED ORDER — SODIUM CHLORIDE 0.9 % IV SOLN
250.0000 mg | INTRAVENOUS | Status: DC
  Administered 2023-04-05: 250 mg via INTRAVENOUS
  Filled 2023-04-05: qty 250

## 2023-04-12 ENCOUNTER — Encounter (HOSPITAL_COMMUNITY)
Admission: RE | Admit: 2023-04-12 | Discharge: 2023-04-12 | Disposition: A | Discharge status: Home / Self Care | Payer: Self-pay | Source: Ambulatory Visit | Attending: Family Medicine | Admitting: Family Medicine

## 2023-04-12 DIAGNOSIS — D500 Iron deficiency anemia secondary to blood loss (chronic): Secondary | ICD-10-CM

## 2023-04-12 MED ORDER — SODIUM CHLORIDE 0.9 % IV SOLN
250.0000 mg | INTRAVENOUS | Status: DC
  Administered 2023-04-12: 250 mg via INTRAVENOUS
  Filled 2023-04-12: qty 250

## 2023-04-14 NOTE — Progress Notes (Deleted)
    SUBJECTIVE:   CHIEF COMPLAINT / HPI: mood, tobocco use  ***  PERTINENT  PMH / PSH: Hypothyroidism (post-op), IDA, anxiety  OBJECTIVE:   There were no vitals taken for this visit.  ***  ASSESSMENT/PLAN:   There are no diagnoses linked to this encounter. No follow-ups on file.  Celine Mans, MD [facility]

## 2023-04-15 ENCOUNTER — Ambulatory Visit: Payer: Self-pay | Admitting: Family Medicine

## 2023-04-19 ENCOUNTER — Encounter (HOSPITAL_COMMUNITY)
Admission: RE | Admit: 2023-04-19 | Discharge: 2023-04-19 | Disposition: A | Discharge status: Home / Self Care | Payer: Self-pay | Source: Ambulatory Visit | Attending: Family Medicine | Admitting: Family Medicine

## 2023-04-19 DIAGNOSIS — D500 Iron deficiency anemia secondary to blood loss (chronic): Secondary | ICD-10-CM

## 2023-04-19 MED ORDER — SODIUM CHLORIDE 0.9 % IV SOLN
250.0000 mg | INTRAVENOUS | Status: DC
  Administered 2023-04-19: 250 mg via INTRAVENOUS
  Filled 2023-04-19: qty 20

## 2023-05-03 ENCOUNTER — Other Ambulatory Visit: Payer: Self-pay

## 2023-05-03 DIAGNOSIS — F419 Anxiety disorder, unspecified: Secondary | ICD-10-CM

## 2023-05-06 MED ORDER — BUPROPION HCL ER (XL) 150 MG PO TB24
150.0000 mg | ORAL_TABLET | Freq: Two times a day (BID) | ORAL | 0 refills | Status: AC
Start: 2023-05-06 — End: 2023-08-04

## 2023-05-10 ENCOUNTER — Other Ambulatory Visit: Payer: Self-pay | Admitting: Family Medicine

## 2023-05-10 DIAGNOSIS — E890 Postprocedural hypothyroidism: Secondary | ICD-10-CM

## 2023-06-05 ENCOUNTER — Other Ambulatory Visit: Payer: Self-pay | Admitting: Family Medicine

## 2023-06-05 DIAGNOSIS — E890 Postprocedural hypothyroidism: Secondary | ICD-10-CM

## 2023-06-28 ENCOUNTER — Other Ambulatory Visit: Payer: Self-pay | Admitting: Family Medicine

## 2023-06-28 DIAGNOSIS — E890 Postprocedural hypothyroidism: Secondary | ICD-10-CM

## 2023-06-30 ENCOUNTER — Ambulatory Visit (INDEPENDENT_AMBULATORY_CARE_PROVIDER_SITE_OTHER): Payer: Self-pay | Admitting: Family Medicine

## 2023-06-30 ENCOUNTER — Encounter: Payer: Self-pay | Admitting: Family Medicine

## 2023-06-30 VITALS — BP 127/76 | HR 92 | Wt 189.0 lb

## 2023-06-30 DIAGNOSIS — D500 Iron deficiency anemia secondary to blood loss (chronic): Secondary | ICD-10-CM

## 2023-06-30 DIAGNOSIS — E890 Postprocedural hypothyroidism: Secondary | ICD-10-CM

## 2023-06-30 MED ORDER — LEVOTHYROXINE SODIUM 175 MCG PO TABS
ORAL_TABLET | ORAL | 0 refills | Status: DC
Start: 2023-06-30 — End: 2023-06-30

## 2023-06-30 MED ORDER — LEVOTHYROXINE SODIUM 175 MCG PO TABS
ORAL_TABLET | ORAL | 1 refills | Status: DC
Start: 2023-06-30 — End: 2023-06-30

## 2023-06-30 MED ORDER — LEVOTHYROXINE SODIUM 175 MCG PO TABS
ORAL_TABLET | ORAL | 1 refills | Status: DC
Start: 2023-06-30 — End: 2023-08-29

## 2023-06-30 NOTE — Assessment & Plan Note (Signed)
Refill Synthroid 175 mcg daily.  Recheck TSH 1 month.  Lab appointment scheduled.  Discussed with patient importance of following up for lab appointment in order to accurately treat her hypothyroidism.

## 2023-06-30 NOTE — Assessment & Plan Note (Signed)
S/p multiple iron transfusions in July.  Will recheck CBC today.  Given chronic history (thought to be secondary to menorrhagia), consider referral to hematology if hemoglobin continues to be low.

## 2023-06-30 NOTE — Progress Notes (Signed)
    SUBJECTIVE:   CHIEF COMPLAINT / HPI:   IDA - History of heavy cycles. Tries to take "blood builder". Has been getting infusions. Does feel lightheaded sometimes and with less energy.  Last cycle was last week and was heavy.  Hypothyroidism - Feels more tired, intermittently out of her thyroid medicine.  States has been hard for her to follow-up appropriately due to her work.  She travels a lot for sales.  PERTINENT  PMH / PSH: IDA, Hypothryoidism  OBJECTIVE:   BP 127/76   Pulse 92   Wt 189 lb (85.7 kg)   LMP 06/23/2023   SpO2 99%   BMI 26.36 kg/m   General: NAD, well appearing Neuro: A&O Cardiovascular: RRR, no murmurs, no peripheral edema Respiratory: normal WOB on RA, CTAB, no wheezes, ronchi or rales Extremities: Moving all 4 extremities equally   ASSESSMENT/PLAN:   Assessment & Plan Hypothyroidism, postop Refill Synthroid 175 mcg daily.  Recheck TSH 1 month.  Lab appointment scheduled.  Discussed with patient importance of following up for lab appointment in order to accurately treat her hypothyroidism. Iron deficiency anemia due to chronic blood loss S/p multiple iron transfusions in July.  Will recheck CBC today.  Given chronic history (thought to be secondary to menorrhagia), consider referral to hematology if hemoglobin continues to be low.  Return in about 4 weeks (around 07/28/2023).  Celine Mans, MD [facility]

## 2023-06-30 NOTE — Patient Instructions (Signed)
It was great to see you! Thank you for allowing me to participate in your care!  Our plans for today:  -Please make sure to come in for your follow-up lab check of your thyroid in 4 weeks. -I will let you know what the plan is for your blood level (hemoglobin) today. -I have refilled your thyroid medication for 1 month.   Please arrive 15 minutes PRIOR to your next scheduled appointment time! If you do not, this affects OTHER patients' care.  Take care and seek immediate care sooner if you develop any concerns.   Celine Mans, MD, PGY-2 [facility] 8:57 AM 06/30/2023  [facility]

## 2023-07-01 LAB — CBC
Hematocrit: 39.3 % (ref 34.0–46.6)
Hemoglobin: 12.6 g/dL (ref 11.1–15.9)
MCH: 27.8 pg (ref 26.6–33.0)
MCHC: 32.1 g/dL (ref 31.5–35.7)
MCV: 87 fL (ref 79–97)
Platelets: 346 10*3/uL (ref 150–450)
RBC: 4.53 x10E6/uL (ref 3.77–5.28)
RDW: 18.6 % — ABNORMAL HIGH (ref 11.7–15.4)
WBC: 7 10*3/uL (ref 3.4–10.8)

## 2023-07-04 ENCOUNTER — Encounter: Payer: Self-pay | Admitting: Family Medicine

## 2023-07-05 ENCOUNTER — Encounter: Payer: Self-pay | Admitting: Family Medicine

## 2023-07-05 ENCOUNTER — Telehealth: Payer: Self-pay

## 2023-07-05 NOTE — Telephone Encounter (Signed)
Patient calls nurse line regarding results from visit on 06/30/23.  Advised of results per note from Dr. Velna Ochs.   Patient has follow up lab visit scheduled for 07/15/2023.  Veronda Prude, RN

## 2023-07-15 ENCOUNTER — Other Ambulatory Visit: Payer: Self-pay

## 2023-08-01 ENCOUNTER — Other Ambulatory Visit: Payer: Self-pay

## 2023-08-27 ENCOUNTER — Other Ambulatory Visit: Payer: Self-pay | Admitting: Family Medicine

## 2023-08-27 DIAGNOSIS — E890 Postprocedural hypothyroidism: Secondary | ICD-10-CM

## 2023-08-29 ENCOUNTER — Other Ambulatory Visit: Payer: Self-pay | Admitting: Family Medicine

## 2023-08-29 DIAGNOSIS — E890 Postprocedural hypothyroidism: Secondary | ICD-10-CM

## 2023-09-01 ENCOUNTER — Encounter: Payer: Self-pay | Admitting: Family Medicine

## 2023-09-01 ENCOUNTER — Ambulatory Visit (INDEPENDENT_AMBULATORY_CARE_PROVIDER_SITE_OTHER): Payer: Self-pay | Admitting: Family Medicine

## 2023-09-01 VITALS — BP 120/85 | HR 79 | Ht 71.0 in | Wt 193.8 lb

## 2023-09-01 DIAGNOSIS — E890 Postprocedural hypothyroidism: Secondary | ICD-10-CM

## 2023-09-01 DIAGNOSIS — N939 Abnormal uterine and vaginal bleeding, unspecified: Secondary | ICD-10-CM | POA: Diagnosis not present

## 2023-09-01 MED ORDER — LEVOTHYROXINE SODIUM 175 MCG PO TABS
175.0000 ug | ORAL_TABLET | Freq: Every day | ORAL | 0 refills | Status: DC
Start: 2023-09-01 — End: 2023-10-10

## 2023-09-01 NOTE — Progress Notes (Signed)
    SUBJECTIVE:   CHIEF COMPLAINT / HPI:   Here to check TSH. Needs refill of Synthroid.  Has only been out of Synthroid for 1 day  AUB - Intermittent chronic. Last longer than normal menstrual. Not any kind of birth control. Has previously tried IUD and a form of oral birth control. Previous imaging 5 years ago. Currently not bleeding. Want iron checked. Has not seen Gynecologist. Cycles can last up to 2 weeks sometimes longer.   PERTINENT  PMH / PSH: Hypothyroidism, Tobacco use  OBJECTIVE:   BP 120/85   Pulse 79   Ht 5\' 11"  (1.803 m)   Wt 193 lb 12.8 oz (87.9 kg)   LMP 07/12/2023   SpO2 97%   BMI 27.03 kg/m   General: NAD, well appearing HEENT: MMM, sclerae not pale Neuro: A&O Respiratory: normal WOB on RA Extremities: Moving all 4 extremities equally   ASSESSMENT/PLAN:   Assessment & Plan Hypothyroidism, postop Check TSH today.  Refilled for 1 month, adjust dose as indicated by TSH.  Counseled on need for consistent care to make sure that we are treating her adequately. Abnormal uterine bleeding Extensive history documented in the problem list regarding chronic abnormal uterine bleeding thought to be related to her thyroid now status post thyroidectomy.  However, she has continued to have abnormal vaginal bleeding may be related to early onset menopause over the past year.  Has previously tried several medications.  Believe this now warrants referral to gynecology for further workup.  May require endometrial biopsy.  Discussed with patient who is agreeable to plan.  Repeat complete pelvic with transvaginal ultrasound.  Repeat CBC in iron panel.  Return in about 2 months (around 11/02/2023).  Celine Mans, MD [facility]

## 2023-09-01 NOTE — Assessment & Plan Note (Signed)
Check TSH today.  Refilled for 1 month, adjust dose as indicated by TSH.  Counseled on need for consistent care to make sure that we are treating her adequately.

## 2023-09-01 NOTE — Assessment & Plan Note (Signed)
Extensive history documented in the problem list regarding chronic abnormal uterine bleeding thought to be related to her thyroid now status post thyroidectomy.  However, she has continued to have abnormal vaginal bleeding may be related to early onset menopause over the past year.  Has previously tried several medications.  Believe this now warrants referral to gynecology for further workup.  May require endometrial biopsy.  Discussed with patient who is agreeable to plan.  Repeat complete pelvic with transvaginal ultrasound.  Repeat CBC in iron panel.

## 2023-09-01 NOTE — Patient Instructions (Addendum)
It was great to see you! Thank you for allowing me to participate in your care!  Our plans for today:  - I have placed a referral to gynecology, they should call you schedule. - They should call you to schedule your [place].  - I have refilled thyroid medicine. - I will see you again in 1 month.   Please arrive 15 minutes PRIOR to your next scheduled appointment time! If you do not, this affects OTHER patients' care.  Take care and seek immediate care sooner if you develop any concerns.   Celine Mans, MD, PGY-2 [facility] 11:42 AM 09/01/2023  [facility]

## 2023-09-02 LAB — IRON,TIBC AND FERRITIN PANEL
Ferritin: 20 ng/mL (ref 15–150)
Iron Saturation: 18 % (ref 15–55)
Iron: 63 ug/dL (ref 27–159)
Total Iron Binding Capacity: 341 ug/dL (ref 250–450)
UIBC: 278 ug/dL (ref 131–425)

## 2023-09-02 LAB — CBC
Hematocrit: 39.1 % (ref 34.0–46.6)
Hemoglobin: 12.5 g/dL (ref 11.1–15.9)
MCH: 28.7 pg (ref 26.6–33.0)
MCHC: 32 g/dL (ref 31.5–35.7)
MCV: 90 fL (ref 79–97)
Platelets: 373 10*3/uL (ref 150–450)
RBC: 4.36 x10E6/uL (ref 3.77–5.28)
RDW: 13.7 % (ref 11.7–15.4)
WBC: 10.2 10*3/uL (ref 3.4–10.8)

## 2023-09-05 ENCOUNTER — Encounter: Payer: Self-pay | Admitting: Family Medicine

## 2023-09-05 LAB — TSH: TSH: 1.1 u[IU]/mL (ref 0.450–4.500)

## 2023-09-05 LAB — SPECIMEN STATUS REPORT

## 2023-09-06 ENCOUNTER — Ambulatory Visit (HOSPITAL_COMMUNITY): Admission: RE | Admit: 2023-09-06 | Payer: BC Managed Care – PPO | Source: Ambulatory Visit

## 2023-10-10 ENCOUNTER — Other Ambulatory Visit: Payer: Self-pay | Admitting: Family Medicine

## 2023-10-10 DIAGNOSIS — E890 Postprocedural hypothyroidism: Secondary | ICD-10-CM

## 2023-10-10 MED ORDER — LEVOTHYROXINE SODIUM 175 MCG PO TABS
175.0000 ug | ORAL_TABLET | Freq: Every day | ORAL | 2 refills | Status: DC
Start: 2023-10-10 — End: 2024-02-29

## 2023-10-19 DIAGNOSIS — J0100 Acute maxillary sinusitis, unspecified: Secondary | ICD-10-CM | POA: Diagnosis not present

## 2023-11-03 ENCOUNTER — Ambulatory Visit: Payer: BC Managed Care – PPO | Admitting: Obstetrics and Gynecology

## 2024-02-07 DIAGNOSIS — Z01419 Encounter for gynecological examination (general) (routine) without abnormal findings: Secondary | ICD-10-CM | POA: Diagnosis not present

## 2024-02-07 DIAGNOSIS — N949 Unspecified condition associated with female genital organs and menstrual cycle: Secondary | ICD-10-CM | POA: Diagnosis not present

## 2024-02-07 DIAGNOSIS — N926 Irregular menstruation, unspecified: Secondary | ICD-10-CM | POA: Diagnosis not present

## 2024-02-07 DIAGNOSIS — R3915 Urgency of urination: Secondary | ICD-10-CM | POA: Diagnosis not present

## 2024-02-07 DIAGNOSIS — Z113 Encounter for screening for infections with a predominantly sexual mode of transmission: Secondary | ICD-10-CM | POA: Diagnosis not present

## 2024-02-07 DIAGNOSIS — N898 Other specified noninflammatory disorders of vagina: Secondary | ICD-10-CM | POA: Diagnosis not present

## 2024-02-07 DIAGNOSIS — N9089 Other specified noninflammatory disorders of vulva and perineum: Secondary | ICD-10-CM | POA: Diagnosis not present

## 2024-02-09 DIAGNOSIS — N9089 Other specified noninflammatory disorders of vulva and perineum: Secondary | ICD-10-CM | POA: Diagnosis not present

## 2024-02-09 DIAGNOSIS — N843 Polyp of vulva: Secondary | ICD-10-CM | POA: Diagnosis not present

## 2024-02-22 DIAGNOSIS — R202 Paresthesia of skin: Secondary | ICD-10-CM | POA: Diagnosis not present

## 2024-02-22 DIAGNOSIS — Z720 Tobacco use: Secondary | ICD-10-CM | POA: Diagnosis not present

## 2024-02-22 DIAGNOSIS — R079 Chest pain, unspecified: Secondary | ICD-10-CM | POA: Diagnosis not present

## 2024-02-22 DIAGNOSIS — M25512 Pain in left shoulder: Secondary | ICD-10-CM | POA: Diagnosis not present

## 2024-02-22 DIAGNOSIS — I10 Essential (primary) hypertension: Secondary | ICD-10-CM | POA: Diagnosis not present

## 2024-02-22 DIAGNOSIS — Z885 Allergy status to narcotic agent status: Secondary | ICD-10-CM | POA: Diagnosis not present

## 2024-02-27 DIAGNOSIS — E039 Hypothyroidism, unspecified: Secondary | ICD-10-CM | POA: Diagnosis not present

## 2024-02-27 DIAGNOSIS — R079 Chest pain, unspecified: Secondary | ICD-10-CM | POA: Diagnosis not present

## 2024-02-27 DIAGNOSIS — R002 Palpitations: Secondary | ICD-10-CM | POA: Diagnosis not present

## 2024-02-28 ENCOUNTER — Other Ambulatory Visit: Payer: Self-pay | Admitting: Family Medicine

## 2024-02-28 DIAGNOSIS — E890 Postprocedural hypothyroidism: Secondary | ICD-10-CM

## 2024-03-13 ENCOUNTER — Ambulatory Visit (INDEPENDENT_AMBULATORY_CARE_PROVIDER_SITE_OTHER): Admitting: Family Medicine

## 2024-03-13 ENCOUNTER — Encounter: Payer: Self-pay | Admitting: Family Medicine

## 2024-03-13 VITALS — BP 120/88 | HR 97 | Ht 71.0 in | Wt 195.4 lb

## 2024-03-13 DIAGNOSIS — F419 Anxiety disorder, unspecified: Secondary | ICD-10-CM

## 2024-03-13 DIAGNOSIS — E890 Postprocedural hypothyroidism: Secondary | ICD-10-CM

## 2024-03-13 MED ORDER — HYDROXYZINE HCL 10 MG PO TABS: 10.0000 mg | ORAL_TABLET | Freq: Three times a day (TID) | ORAL | 1 refills | Status: DC | PRN

## 2024-03-13 NOTE — Progress Notes (Signed)
    SUBJECTIVE:   CHIEF COMPLAINT / HPI: ED f/u  Anxiety  Smoking Cessation She was seen in the emergency department at [place] on 5/28 due to chest pain, palpitation.  She was concerned that she was having a heart attack.  She was given a clean bill of health and started on as needed hydroxyzine  for anxiety.  She tells me that this has been tremendously helpful.  She does have a known history of anxiety and tells me that she has been on daily medications including SSRIs in the past but nothing is required as well as the hydroxyzine .  She has a bottle of 10 mg tablets and needs between 0.5 and 2 tablets daily to control her symptoms.   As a result of her ER visit, she also quit smoking on 5/28. Went from 2 packs/day to none and is very pleased with this improvement in her overall health.   Health Maintenance [facility] for colonoscopy. July 7. Declines Tdap and pneumonia vaccine today  PERTINENT  PMH / PSH: Hypothyroidism, AUB, tobacco abuse, bipolar 1  OBJECTIVE:   BP 120/88   Pulse 97   Ht 5' 11 (1.803 m)   Wt 195 lb 6.4 oz (88.6 kg)   LMP 01/26/2024   SpO2 98%   BMI 27.25 kg/m   General: alert & oriented, no apparent distress, well groomed HEENT: normocephalic, atraumatic, EOM grossly intact, oral mucosa moist, neck supple Respiratory: normal respiratory effort GI: non-distended Skin: no rashes, no jaundice Psych: appropriate mood and affect   ASSESSMENT/PLAN:   Assessment & Plan Anxiety Excellent symptom control just with as needed hydroxyzine .  She is needing between 5 and 20 mg daily which is a very reasonable dose range.  Discussed possibly introducing or transitioning to an SSRI for daily therapy but she is not interested and prefers to continue with the hydroxyzine  regimen that she is on.  This is a reasonable option for her given her excellent response. -Continue hydroxyzine , written for 10 mg up to 3 times daily as needed - Congratulated her on  smoking cessation, this is the very best thing she could have done for her health   J Lark Plum, MD [facility]

## 2024-03-13 NOTE — Patient Instructions (Addendum)
 Congrats on quitting smoking!! That is the very best thing you can do for your health.  I think refilling your hydroxyzine  is a reasonable option. Do let use know if you think you'd like to try an everyday med.  We will eagerly await your colonoscopy report from [facility].   Alexa Andrews, MD

## 2024-03-27 ENCOUNTER — Other Ambulatory Visit: Payer: Self-pay | Admitting: Family Medicine

## 2024-03-27 DIAGNOSIS — E890 Postprocedural hypothyroidism: Secondary | ICD-10-CM

## 2024-04-29 ENCOUNTER — Other Ambulatory Visit: Payer: Self-pay | Admitting: Family Medicine

## 2024-04-29 DIAGNOSIS — E890 Postprocedural hypothyroidism: Secondary | ICD-10-CM

## 2024-05-28 ENCOUNTER — Other Ambulatory Visit: Payer: Self-pay | Admitting: Family Medicine

## 2024-05-28 DIAGNOSIS — E890 Postprocedural hypothyroidism: Secondary | ICD-10-CM

## 2024-05-31 ENCOUNTER — Other Ambulatory Visit: Payer: Self-pay

## 2024-05-31 DIAGNOSIS — F419 Anxiety disorder, unspecified: Secondary | ICD-10-CM

## 2024-06-01 MED ORDER — HYDROXYZINE HCL 10 MG PO TABS: 10.0000 mg | ORAL_TABLET | Freq: Three times a day (TID) | ORAL | 0 refills | Status: DC | PRN

## 2024-06-29 ENCOUNTER — Other Ambulatory Visit: Payer: Self-pay | Admitting: Family Medicine

## 2024-06-29 ENCOUNTER — Ambulatory Visit

## 2024-06-29 DIAGNOSIS — F419 Anxiety disorder, unspecified: Secondary | ICD-10-CM

## 2024-07-03 ENCOUNTER — Other Ambulatory Visit: Payer: Self-pay | Admitting: Family Medicine

## 2024-07-03 DIAGNOSIS — E890 Postprocedural hypothyroidism: Secondary | ICD-10-CM

## 2024-07-30 ENCOUNTER — Other Ambulatory Visit: Payer: Self-pay | Admitting: Family Medicine

## 2024-07-30 DIAGNOSIS — E890 Postprocedural hypothyroidism: Secondary | ICD-10-CM

## 2024-09-01 ENCOUNTER — Other Ambulatory Visit: Payer: Self-pay | Admitting: Family Medicine

## 2024-09-01 DIAGNOSIS — E890 Postprocedural hypothyroidism: Secondary | ICD-10-CM

## 2024-09-07 ENCOUNTER — Other Ambulatory Visit: Payer: Self-pay | Admitting: Family Medicine

## 2024-09-07 DIAGNOSIS — E890 Postprocedural hypothyroidism: Secondary | ICD-10-CM

## 2024-09-10 ENCOUNTER — Other Ambulatory Visit: Payer: Self-pay | Admitting: Family Medicine

## 2024-09-10 DIAGNOSIS — E890 Postprocedural hypothyroidism: Secondary | ICD-10-CM

## 2024-09-10 DIAGNOSIS — F419 Anxiety disorder, unspecified: Secondary | ICD-10-CM

## 2024-09-10 NOTE — Telephone Encounter (Signed)
 Patient calls nurse line to follow up on refill request.   She has scheduled follow up with PCP tomorrow. Patient states that she has been out of medication for the last four days.   Chiquita JAYSON English, RN

## 2024-09-11 ENCOUNTER — Encounter: Payer: Self-pay | Admitting: Family Medicine

## 2024-09-11 ENCOUNTER — Ambulatory Visit: Payer: Self-pay | Admitting: Family Medicine

## 2024-09-11 VITALS — BP 117/69 | HR 85 | Ht 71.0 in | Wt 205.4 lb

## 2024-09-11 DIAGNOSIS — Z1211 Encounter for screening for malignant neoplasm of colon: Secondary | ICD-10-CM | POA: Diagnosis not present

## 2024-09-11 DIAGNOSIS — E890 Postprocedural hypothyroidism: Secondary | ICD-10-CM

## 2024-09-11 DIAGNOSIS — N939 Abnormal uterine and vaginal bleeding, unspecified: Secondary | ICD-10-CM | POA: Diagnosis not present

## 2024-09-11 DIAGNOSIS — Z13228 Encounter for screening for other metabolic disorders: Secondary | ICD-10-CM

## 2024-09-11 NOTE — Progress Notes (Signed)
° ° °  SUBJECTIVE:   CHIEF COMPLAINT / HPI: medication refill  Discussed the use of AI scribe software for clinical note transcription with the patient, who gave verbal consent to proceed.  History of Present Illness Katie Obrien is a 48 year old female with hypothyroidism who presents for medication refill of synthroid    - Fatigue and sleepiness began after missing Synthroid  for five days. - Symptoms include feeling unwell during this period. - Thyroid  levels have not been checked for approximately one year.  Abnormal uterine bleeding and menopausal symptoms - History of abnormal uterine bleeding previously evaluated by gynecology. - No uterine bleeding for the past two months. - Prior heavy bleeding and night sweats have largely resolved. - Increased symptoms of heavy bleeding and night sweats during the five days off Synthroid . - Questions if symptoms are related to menopause as she is turning 48.  Tobacco use and weight gain - Resumed smoking after a five-month quit attempt due to a 25-pound weight gain.  Preventive health maintenance - Never had a mammogram and plans to schedule one with gynecology. - Overdue for colorectal cancer screening and has considered but not completed Cologuard.    PERTINENT  PMH / PSH: Hypothyroidism, Bipolar 1, Hx of AUB, Hx of tubal ligation  OBJECTIVE:   BP 117/69   Pulse 85   Ht 5' 11 (1.803 m)   Wt 205 lb 6 oz (93.2 kg)   SpO2 100%   BMI 28.64 kg/m   Physical Exam General: NAD, well appearing Neuro: A&O Cardiovascular: RRR, no murmurs,  Respiratory: normal WOB on RA, CTAB, no wheezes, ronchi or rales Extremities: Moving all 4 extremities equally, no peripheral edema   ASSESSMENT/PLAN:   Assessment & Plan Hypothyroidism, postop Need to check TSH after resuming Synthroid . Future ordered placed. Refill of Synthroid  175mcg placed. Abnormal uterine bleeding Evaluated by Gynecologist, likely menopause. Consistent with history  provided today by patient. Will request records. Colon cancer screening Colonoscopy referral placed.  Screening for metabolic disorder Future A1c and Lipid panel ordered placed.  Patient left before AVS could be given.   Return in about 6 months (around 03/12/2025).  Ozell Provencal, MD, PGY-3 [facility] 8:59 AM 09/11/2024  [facility]

## 2024-09-11 NOTE — Assessment & Plan Note (Signed)
 Evaluated by Gynecologist, likely menopause. Consistent with history provided today by patient. Will request records.

## 2024-09-11 NOTE — Assessment & Plan Note (Signed)
 Need to check TSH after resuming Synthroid . Future ordered placed. Refill of Synthroid  175mcg placed.

## 2024-09-11 NOTE — Patient Instructions (Addendum)
 It was great to see you! Thank you for allowing me to participate in your care!  Our plans for today:   VISIT SUMMARY: Today, we discussed your recent fatigue and sleepiness due to missing your thyroid  medication for five days. We also reviewed your history of abnormal uterine bleeding, menopausal symptoms, and preventive health maintenance needs.  YOUR PLAN: HYPOTHYROIDISM -Schedule a lab visit to check your thyroid  levels in the next 2 weeks once you start taking your medication -Make sure to take your thyroid  medication consistently, I have refilled it  ABNORMAL UTERINE BLEEDING/MENOPAUSE: Your abnormal uterine bleeding has resolved, with no bleeding for the past two months. -No further action needed at this time.  GENERAL HEALTH MAINTENANCE: You are due for a mammogram and colonoscopy. -Schedule a mammogram through your gynecologist. -I have ordered a colonoscopy, they should call you to schedule -Ensure that records from your gynecologist and gastroenterologist are sent over to out clinic   Please arrive 15 minutes PRIOR to your next scheduled appointment time! If you do not, this affects OTHER patients' care.  Take care and seek immediate care sooner if you develop any concerns.   Katie Provencal, MD, PGY-3 [facility] 8:51 AM 09/11/2024  [facility]

## 2024-10-06 ENCOUNTER — Other Ambulatory Visit: Payer: Self-pay | Admitting: Family Medicine

## 2024-10-06 DIAGNOSIS — F419 Anxiety disorder, unspecified: Secondary | ICD-10-CM

## 2024-10-06 DIAGNOSIS — E890 Postprocedural hypothyroidism: Secondary | ICD-10-CM

## 2024-10-08 ENCOUNTER — Other Ambulatory Visit: Payer: Self-pay

## 2024-10-08 DIAGNOSIS — E890 Postprocedural hypothyroidism: Secondary | ICD-10-CM

## 2024-10-08 DIAGNOSIS — Z13228 Encounter for screening for other metabolic disorders: Secondary | ICD-10-CM

## 2024-10-09 LAB — LIPID PANEL
Chol/HDL Ratio: 5.8 ratio — ABNORMAL HIGH (ref 0.0–4.4)
Cholesterol, Total: 255 mg/dL — ABNORMAL HIGH (ref 100–199)
HDL: 44 mg/dL
LDL Chol Calc (NIH): 200 mg/dL — ABNORMAL HIGH (ref 0–99)
Triglycerides: 67 mg/dL (ref 0–149)
VLDL Cholesterol Cal: 11 mg/dL (ref 5–40)

## 2024-10-09 LAB — HEMOGLOBIN A1C
Est. average glucose Bld gHb Est-mCnc: 103 mg/dL
Hgb A1c MFr Bld: 5.2 % (ref 4.8–5.6)

## 2024-10-09 LAB — TSH: TSH: 0.165 u[IU]/mL — ABNORMAL LOW (ref 0.450–4.500)

## 2024-10-12 ENCOUNTER — Ambulatory Visit: Payer: Self-pay | Admitting: Family Medicine

## 2024-10-12 DIAGNOSIS — E890 Postprocedural hypothyroidism: Secondary | ICD-10-CM

## 2024-10-15 MED ORDER — LEVOTHYROXINE SODIUM 150 MCG PO TABS: 150.0000 ug | ORAL_TABLET | Freq: Every day | ORAL | 1 refills | Status: AC

## 2024-10-15 NOTE — Telephone Encounter (Signed)
 Call patient to discuss recent lab results.  TSH 0.165.  Will decrease Synthroid  to 150 mcg.  Sent to patient's requested pharmacy in [place].  Repeat TSH 6 weeks.  Elevated cholesterol.  Total cholesterol 255, LDL 200.  Recommend the patient that she come in for payment to discuss possible causes and evaluate cardiac risk factors.  Patient amenable.  Will discuss at 6-week follow-up.  Explained dietary sources of cholesterol, recommended low-fat diet as well.

## 2024-10-25 ENCOUNTER — Encounter: Payer: Self-pay | Admitting: Family Medicine

## 2024-10-25 ENCOUNTER — Ambulatory Visit: Payer: Self-pay | Admitting: Family Medicine

## 2024-10-25 VITALS — BP 132/83 | HR 85 | Ht 71.0 in | Wt 205.8 lb

## 2024-10-25 DIAGNOSIS — E890 Postprocedural hypothyroidism: Secondary | ICD-10-CM | POA: Diagnosis not present

## 2024-10-25 DIAGNOSIS — E785 Hyperlipidemia, unspecified: Secondary | ICD-10-CM | POA: Diagnosis not present

## 2024-10-25 MED ORDER — ROSUVASTATIN CALCIUM 10 MG PO TABS: 10.0000 mg | ORAL_TABLET | Freq: Every day | ORAL | 3 refills | Status: AC

## 2024-10-25 NOTE — Assessment & Plan Note (Signed)
 Cardiovascular risk at 6.4% over ten years.  Will start moderate intensity statin.  Patient agreeable to this.  Also directed to stop keto diet and start Mediterranean diet. - Prescribed Crestor  10 mg daily. - Provided handout on Mediterranean diet.

## 2024-10-25 NOTE — Progress Notes (Signed)
" ° ° °  SUBJECTIVE:   CHIEF COMPLAINT / HPI: Lab follow-up  Discussed the use of AI scribe software for clinical note transcription with the patient, who gave verbal consent to proceed.  History of Present Illness Katie Obrien is a 49 year old female with a history of thyroid  issues and high cholesterol who presents for lab re-evaluation and management of cholesterol.  Hypercholesterolemia - LDL 200 mg/dL with elevated total cholesterol on recent laboratory evaluation - Elevated cholesterol since at least 2019 - Follows a ketogenic diet for five years, suspects dietary contribution to hypercholesterolemia - Quit alcohol in 2022 - Increased sweets intake, currently controlled through ketogenic diet - Expresses concern about cardiovascular risk and desire to address cholesterol and overall risk factors  Thyroid  dysfunction - Currently taking levothyroxine  175 mcg daily - Concerned about recent thyroid  laboratory results  - Smokes tobacco - Concerned about cardiovascular health and being present for her children    PERTINENT  PMH / PSH: Hypothyroidism, hyperlipidemia  OBJECTIVE:   BP 132/83   Pulse 85   Ht 5' 11 (1.803 m)   Wt 205 lb 12.8 oz (93.4 kg)   LMP 09/10/2024   SpO2 98%   BMI 28.70 kg/m   Physical Exam General: NAD, well appearing Neuro: A&O Respiratory: normal WOB on RA Extremities: Moving all 4 extremities equally   ASSESSMENT/PLAN:   Assessment & Plan Hyperlipidemia, unspecified hyperlipidemia type Cardiovascular risk at 6.4% over ten years.  Will start moderate intensity statin.  Patient agreeable to this.  Also directed to stop keto diet and start Mediterranean diet. - Prescribed Crestor  10 mg daily. - Provided handout on Mediterranean diet. Hypothyroidism, postop TSH notably 0.165 in early January.  Directed to start the new dose of Synthroid  at 150 mcg that I prescribed earlier this month.  Patient agreeable to this.  Recheck TSH 6  weeks.    Return in about 6 weeks (around 12/06/2024).  Ozell Provencal, MD, PGY-3 [place] [facility] 5:29 PM 10/25/2024  [facility]   "

## 2024-10-25 NOTE — Patient Instructions (Addendum)
 It was great to see you! Thank you for allowing me to participate in your care!  Our plans for today:   VISIT SUMMARY: Today we discussed your cholesterol management and thyroid  function. We reviewed your recent lab results and addressed your concerns about cardiovascular health.  YOUR PLAN: HYPERLIPIDEMIA: You have high cholesterol with an LDL level of 200 mg/dL, which increases your cardiovascular risk. -Start taking Crestor  10 mg daily. -Follow a Mediterranean diet for six weeks. A handout was provided.   Contains text generated by [employer].    Please arrive 15 minutes PRIOR to your next scheduled appointment time! If you do not, this affects OTHER patients' care.  Take care and seek immediate care sooner if you develop any concerns.   Katie Provencal, MD, PGY-3 [facility] 3:43 PM 10/25/2024  [facility]

## 2024-10-25 NOTE — Assessment & Plan Note (Signed)
 TSH notably 0.165 in early January.  Directed to start the new dose of Synthroid  at 150 mcg that I prescribed earlier this month.  Patient agreeable to this.  Recheck TSH 6 weeks.

## 2024-10-31 ENCOUNTER — Encounter: Payer: Self-pay | Admitting: Gastroenterology

## 2024-11-20 ENCOUNTER — Ambulatory Visit: Payer: Self-pay | Admitting: Family Medicine
